# Patient Record
Sex: Male | Born: 1970 | Race: Black or African American | Hispanic: No | Marital: Single | Smoking: Former smoker
Health system: Southern US, Community
[De-identification: ages and names within clinical notes are randomized; demographics above are authoritative.]

## PROBLEM LIST (undated history)

## (undated) DIAGNOSIS — I6200 Nontraumatic subdural hemorrhage, unspecified: Secondary | ICD-10-CM

## (undated) DIAGNOSIS — M4622 Osteomyelitis of vertebra, cervical region: Secondary | ICD-10-CM

## (undated) HISTORY — PX: NO PAST SURGERIES: SHX2092

---

## 1998-05-29 ENCOUNTER — Emergency Department (HOSPITAL_COMMUNITY): Admission: EM | Admit: 1998-05-29 | Discharge: 1998-05-29 | Payer: Self-pay | Admitting: Emergency Medicine

## 1998-05-30 ENCOUNTER — Encounter: Payer: Self-pay | Admitting: Emergency Medicine

## 1998-08-27 ENCOUNTER — Encounter: Payer: Self-pay | Admitting: Emergency Medicine

## 1998-08-27 ENCOUNTER — Emergency Department (HOSPITAL_COMMUNITY): Admission: EM | Admit: 1998-08-27 | Discharge: 1998-08-27 | Payer: Self-pay | Admitting: Emergency Medicine

## 2003-06-06 ENCOUNTER — Emergency Department (HOSPITAL_COMMUNITY): Admission: EM | Admit: 2003-06-06 | Discharge: 2003-06-06 | Payer: Self-pay | Admitting: *Deleted

## 2003-06-08 ENCOUNTER — Emergency Department (HOSPITAL_COMMUNITY): Admission: EM | Admit: 2003-06-08 | Discharge: 2003-06-08 | Payer: Self-pay | Admitting: Emergency Medicine

## 2003-06-10 ENCOUNTER — Emergency Department (HOSPITAL_COMMUNITY): Admission: EM | Admit: 2003-06-10 | Discharge: 2003-06-10 | Payer: Self-pay | Admitting: Emergency Medicine

## 2003-06-14 ENCOUNTER — Emergency Department (HOSPITAL_COMMUNITY): Admission: EM | Admit: 2003-06-14 | Discharge: 2003-06-14 | Payer: Self-pay | Admitting: Emergency Medicine

## 2005-10-09 ENCOUNTER — Emergency Department (HOSPITAL_COMMUNITY): Admission: EM | Admit: 2005-10-09 | Discharge: 2005-10-09 | Payer: Self-pay | Admitting: Emergency Medicine

## 2006-09-04 ENCOUNTER — Emergency Department (HOSPITAL_COMMUNITY): Admission: EM | Admit: 2006-09-04 | Discharge: 2006-09-04 | Payer: Self-pay | Admitting: Emergency Medicine

## 2014-08-17 ENCOUNTER — Emergency Department (HOSPITAL_COMMUNITY)
Admission: EM | Admit: 2014-08-17 | Discharge: 2014-08-17 | Disposition: A | Discharge status: Home / Self Care | Payer: Self-pay | Attending: Emergency Medicine | Admitting: Emergency Medicine

## 2014-08-17 ENCOUNTER — Emergency Department (HOSPITAL_COMMUNITY): Payer: Self-pay

## 2014-08-17 ENCOUNTER — Encounter (HOSPITAL_COMMUNITY): Payer: Self-pay | Admitting: Emergency Medicine

## 2014-08-17 DIAGNOSIS — Y998 Other external cause status: Secondary | ICD-10-CM | POA: Insufficient documentation

## 2014-08-17 DIAGNOSIS — Z720 Tobacco use: Secondary | ICD-10-CM | POA: Insufficient documentation

## 2014-08-17 DIAGNOSIS — T5991XA Toxic effect of unspecified gases, fumes and vapors, accidental (unintentional), initial encounter: Secondary | ICD-10-CM | POA: Insufficient documentation

## 2014-08-17 DIAGNOSIS — R0602 Shortness of breath: Secondary | ICD-10-CM | POA: Insufficient documentation

## 2014-08-17 DIAGNOSIS — J680 Bronchitis and pneumonitis due to chemicals, gases, fumes and vapors: Secondary | ICD-10-CM | POA: Insufficient documentation

## 2014-08-17 DIAGNOSIS — Y929 Unspecified place or not applicable: Secondary | ICD-10-CM | POA: Insufficient documentation

## 2014-08-17 DIAGNOSIS — Y9389 Activity, other specified: Secondary | ICD-10-CM | POA: Insufficient documentation

## 2014-08-17 MED ORDER — ALBUTEROL SULFATE HFA 108 (90 BASE) MCG/ACT IN AERS
1.0000 | INHALATION_SPRAY | RESPIRATORY_TRACT | Status: DC | PRN
  Administered 2014-08-17: 1 via RESPIRATORY_TRACT
  Filled 2014-08-17: qty 6.7

## 2014-08-17 MED ORDER — IPRATROPIUM-ALBUTEROL 0.5-2.5 (3) MG/3ML IN SOLN
3.0000 mL | Freq: Once | RESPIRATORY_TRACT | Status: AC
  Administered 2014-08-17: 3 mL via RESPIRATORY_TRACT
  Filled 2014-08-17: qty 3
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
## 2014-08-17 NOTE — ED Notes (Signed)
MD Otter at bedside. 

## 2014-08-17 NOTE — Discharge Instructions (Signed)
Cough, Adult  A cough is a reflex that helps clear your throat and airways. It can help heal the body or may be a reaction to an irritated airway. A cough may only last 2 or 3 weeks (acute) or may last more than 8 weeks (chronic).  CAUSES Acute cough:  Viral or bacterial infections. Chronic cough:  Infections.  Allergies.  Asthma.  Post-nasal drip.  Smoking.  Heartburn or acid reflux.  Some medicines.  Chronic lung problems (COPD).  Cancer. SYMPTOMS   Cough.  Fever.  Chest pain.  Increased breathing rate.  High-pitched whistling sound when breathing (wheezing).  Colored mucus that you cough up (sputum). TREATMENT   A bacterial cough may be treated with antibiotic medicine.  A viral cough must run its course and will not respond to antibiotics.  Your caregiver may recommend other treatments if you have a chronic cough. HOME CARE INSTRUCTIONS   Only take over-the-counter or prescription medicines for pain, discomfort, or fever as directed by your caregiver. Use cough suppressants only as directed by your caregiver.  Use a cold steam vaporizer or humidifier in your bedroom or home to help loosen secretions.  Sleep in a semi-upright position if your cough is worse at night.  Rest as needed.  Stop smoking if you smoke. SEEK IMMEDIATE MEDICAL CARE IF:   You have pus in your sputum.  Your cough starts to worsen.  You cannot control your cough with suppressants and are losing sleep.  You begin coughing up blood.  You have difficulty breathing.  You develop pain which is getting worse or is uncontrolled with medicine.  You have a fever. MAKE SURE YOU:   Understand these instructions.  Will watch your condition.  Will get help right away if you are not doing well or get worse. Document Released: 11/09/2010 Document Revised: 08/05/2011 Document Reviewed: 11/09/2010 [facility] Patient Information 2015 [place], [place]. This information is not intended  to replace advice given to you by your health care provider. Make sure you discuss any questions you have with your health care provider.  Pneumonitis Pneumonitis is inflammation of the lungs.  CAUSES  Many things can cause pneumonitis. These can include:   A bacterial or viral infection. Pneumonitis due to an infection is usually called pneumonia.  Work-related exposures, including farm and industrial work. Some substances that can cause pneumonitis include asbestos, silica, inhaled acids, or inhaled chlorine gas.   Repeated exposure to bird feathers, bird feces, or other allergens.   Medicine such as chemotherapy drugs, certain antibiotics, and some heart medicines.   Radiation therapy.   Exposure to mold. A hot tub, sauna, or home humidifier can have mold growing in it, even if it looks clean. The mold can be breathed in through water vapor.  Breathing (aspirating) stomach contents, food, or liquids into the lungs.  SIGNS AND SYMPTOMS   Cough.   Shortness of breath or difficulty breathing.   Fever.   Decreased energy.   Decreased appetite.  DIAGNOSIS  To diagnose pneumonitis, your health care provider will do a complete history and physical exam. Various tests may be ordered, such as:   Pulmonary function test.   Chest X-ray.   CT scan of the lungs.   Bronchoscopy.   Lung biopsy.  TREATMENT  Treatment will depend on the cause of the pneumonitis. If the cause is exposure to a substance, avoiding further exposure to that substance will help reduce your symptoms. Possible medical treatments for pneumonitis include:   Corticosteroid medicine  to help decrease inflammation in the lungs.   Antibiotic medicine to help fight a bacterial lung infection.   Oxygen therapy if you are having difficulty breathing.  HOME CARE INSTRUCTIONS   Avoid exposure to any substance identified as the cause of your pneumonitis.   If you must continue to work with  substances that can cause pneumonitis, wear a mask to protect your lungs.   Only take over-the-counter or prescription medicine as directed by your health care provider.   Do not smoke.   If you use inhalers, keep them with you at all times.   Follow up with your health care provider as directed.  SEEK IMMEDIATE MEDICAL CARE IF:   You develop new or increased shortness of breath.   You develop a blue color (cyanosis) under your fingernails.   You have a fever.  MAKE SURE YOU:   Understand these instructions.  Will watch your condition.  Will get help right away if you are not doing well or get worse. Document Released: 10/31/2009 Document Revised: 01/13/2013 Document Reviewed: 11/02/2012 [facility] Patient Information 2015 [place], [place]. This information is not intended to replace advice given to you by your health care provider. Make sure you discuss any questions you have with your health care provider.

## 2014-08-17 NOTE — ED Notes (Signed)
[facility], I contacted poison control. [facility] and he stated that a breathing tx would be the best option. MD Norlene Campbelltter made aware.
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
## 2014-08-17 NOTE — ED Notes (Signed)
While ambulating pt, pt had no complaints. Pt O2 sat stayed at 98% the whole time.

## 2020-05-17 ENCOUNTER — Encounter: Payer: Self-pay | Admitting: Internal Medicine

## 2021-04-19 ENCOUNTER — Other Ambulatory Visit: Payer: Self-pay

## 2021-04-19 ENCOUNTER — Emergency Department (HOSPITAL_COMMUNITY)
Admission: EM | Admit: 2021-04-19 | Discharge: 2021-04-19 | Disposition: A | Discharge status: Home / Self Care | Payer: Self-pay | Attending: Emergency Medicine | Admitting: Emergency Medicine

## 2021-04-19 ENCOUNTER — Emergency Department (HOSPITAL_COMMUNITY): Payer: Self-pay

## 2021-04-19 DIAGNOSIS — R1012 Left upper quadrant pain: Secondary | ICD-10-CM | POA: Insufficient documentation

## 2021-04-19 DIAGNOSIS — R109 Unspecified abdominal pain: Secondary | ICD-10-CM

## 2021-04-19 DIAGNOSIS — W01198A Fall on same level from slipping, tripping and stumbling with subsequent striking against other object, initial encounter: Secondary | ICD-10-CM | POA: Insufficient documentation

## 2021-04-19 DIAGNOSIS — R55 Syncope and collapse: Secondary | ICD-10-CM | POA: Insufficient documentation

## 2021-04-19 DIAGNOSIS — F17210 Nicotine dependence, cigarettes, uncomplicated: Secondary | ICD-10-CM | POA: Insufficient documentation

## 2021-04-19 DIAGNOSIS — R0781 Pleurodynia: Secondary | ICD-10-CM | POA: Insufficient documentation

## 2021-04-19 DIAGNOSIS — W19XXXA Unspecified fall, initial encounter: Secondary | ICD-10-CM

## 2021-04-19 DIAGNOSIS — Y990 Civilian activity done for income or pay: Secondary | ICD-10-CM | POA: Insufficient documentation

## 2021-04-19 LAB — CBC WITH DIFFERENTIAL/PLATELET
Abs Immature Granulocytes: 0.02 10*3/uL (ref 0.00–0.07)
Basophils Absolute: 0 10*3/uL (ref 0.0–0.1)
Basophils Relative: 0 %
Eosinophils Absolute: 0 10*3/uL (ref 0.0–0.5)
Eosinophils Relative: 1 %
HCT: 38.8 % — ABNORMAL LOW (ref 39.0–52.0)
Hemoglobin: 13.4 g/dL (ref 13.0–17.0)
Immature Granulocytes: 0 %
Lymphocytes Relative: 34 %
Lymphs Abs: 1.9 10*3/uL (ref 0.7–4.0)
MCH: 35.1 pg — ABNORMAL HIGH (ref 26.0–34.0)
MCHC: 34.5 g/dL (ref 30.0–36.0)
MCV: 101.6 fL — ABNORMAL HIGH (ref 80.0–100.0)
Monocytes Absolute: 0.5 10*3/uL (ref 0.1–1.0)
Monocytes Relative: 9 %
Neutro Abs: 3 10*3/uL (ref 1.7–7.7)
Neutrophils Relative %: 56 %
Platelets: 258 10*3/uL (ref 150–400)
RBC: 3.82 MIL/uL — ABNORMAL LOW (ref 4.22–5.81)
RDW: 12.2 % (ref 11.5–15.5)
WBC: 5.5 10*3/uL (ref 4.0–10.5)
nRBC: 0 % (ref 0.0–0.2)

## 2021-04-19 LAB — COMPREHENSIVE METABOLIC PANEL
ALT: 25 U/L (ref 0–44)
AST: 31 U/L (ref 15–41)
Albumin: 3.7 g/dL (ref 3.5–5.0)
Alkaline Phosphatase: 59 U/L (ref 38–126)
Anion gap: 9 (ref 5–15)
BUN: 14 mg/dL (ref 6–20)
CO2: 20 mmol/L — ABNORMAL LOW (ref 22–32)
Calcium: 8.8 mg/dL — ABNORMAL LOW (ref 8.9–10.3)
Chloride: 103 mmol/L (ref 98–111)
Creatinine, Ser: 1.14 mg/dL (ref 0.61–1.24)
GFR, Estimated: >60 mL/min (ref >60)
Glucose, Bld: 79 mg/dL (ref 70–99)
Potassium: 4.1 mmol/L (ref 3.5–5.1)
Sodium: 132 mmol/L — ABNORMAL LOW (ref 135–145)
Total Bilirubin: 1.5 mg/dL — ABNORMAL HIGH (ref 0.3–1.2)
Total Protein: 7.3 g/dL (ref 6.5–8.1)

## 2021-04-19 LAB — TROPONIN I (HIGH SENSITIVITY): Troponin I (High Sensitivity): 3 ng/L (ref <18)

## 2021-04-19 MED ORDER — IOHEXOL 300 MG/ML  SOLN
100.0000 mL | Freq: Once | INTRAMUSCULAR | Status: AC | PRN
  Administered 2021-04-19: 100 mL via INTRAVENOUS

## 2021-04-19 MED ORDER — SODIUM CHLORIDE 0.9 % IV BOLUS
1000.0000 mL | Freq: Once | INTRAVENOUS | Status: AC
  Administered 2021-04-19: 1000 mL via INTRAVENOUS

## 2021-04-19 NOTE — ED Provider Notes (Signed)
[facility] EMERGENCY DEPARTMENT Provider Note   CSN: [id number] Arrival date & time: 04/19/21  1317     History Chief Complaint  Patient presents with   Loss of Consciousness   Fall    Larry Lester is a 50 y.o. male.  The history is provided by the patient.  Loss of Consciousness Associated symptoms: chest pain   Associated symptoms: no headaches and no shortness of breath   Fall Associated symptoms include chest pain and abdominal pain. Pertinent negatives include no headaches and no shortness of breath. Patient presents after syncopal episode 3 days ago.  States he has been working 6 days a week.  States that he had a little break and passed out.  States he hit his left chest/arm on a chair.  States he had to sit down for around an hour and 45 minutes before he felt good enough to get up again.  Continued pain to left chest and left abdomen.  No nausea or vomiting.  States he had passed out like this in the 90s but not since then.  States he has not been eating and drinking much.  States he was not taking care of himself as he should.  States this weekend he had drank some and smoked a little.  Also has drank a little today.     No past medical history on file.  There are no problems to display for this patient.   No past surgical history on file.     No family history on file.  Social History   Tobacco Use   Smoking status: Every Day    Types: Cigarettes  Substance Use Topics   Alcohol use: No   Drug use: Yes    Types: Marijuana    Home Medications Prior to Admission medications   Not on File    Allergies    Crab [shellfish allergy]  Review of Systems   Review of Systems  Constitutional:  Negative for appetite change.  HENT:  Negative for congestion.   Respiratory:  Negative for shortness of breath.   Cardiovascular:  Positive for chest pain and syncope.  Gastrointestinal:  Positive for abdominal pain.  Genitourinary:  Negative  for flank pain.  Musculoskeletal:  Negative for back pain.  Skin:  Negative for rash.  Neurological:  Positive for syncope. Negative for light-headedness and headaches.   Physical Exam Updated Vital Signs BP 120/77   Pulse (!) 58   Temp (!) 97.5 F (36.4 C) (Oral)   Resp 15   SpO2 100%   Physical Exam Vitals and nursing note reviewed.  HENT:     Head: Normocephalic and atraumatic.  Eyes:     General: No scleral icterus.    Extraocular Movements: Extraocular movements intact.     Pupils: Pupils are equal, round, and reactive to light.  Cardiovascular:     Rate and Rhythm: Regular rhythm.  Pulmonary:     Comments: Tenderness to left lateral lower chest wall.  No crepitance.  No deformity. Chest:     Chest wall: Tenderness present.  Abdominal:     Tenderness: There is abdominal tenderness.     Comments: Left upper quadrant tenderness.  No rebound or guarding.  No ecchymosis.  Musculoskeletal:        General: No tenderness.     Cervical back: Neck supple. No tenderness.  Skin:    General: Skin is warm.     Capillary Refill: Capillary refill takes less than 2 seconds.  Neurological:     Mental Status: He is alert and oriented to person, place, and time.    ED Results / Procedures / Treatments   Labs (all labs ordered are listed, but only abnormal results are displayed) Labs Reviewed  COMPREHENSIVE METABOLIC PANEL - Abnormal; Notable for the following components:      Result Value   Sodium 132 (*)    CO2 20 (*)    Calcium 8.8 (*)    Total Bilirubin 1.5 (*)    All other components within normal limits  CBC WITH DIFFERENTIAL/PLATELET - Abnormal; Notable for the following components:   RBC 3.82 (*)    HCT 38.8 (*)    MCV 101.6 (*)    MCH 35.1 (*)    All other components within normal limits  TROPONIN I (HIGH SENSITIVITY)    EKG EKG Interpretation  Date/Time:  Thursday April 19 2021 14:26:55 EST Ventricular Rate:  59 PR Interval:  143 QRS Duration: 91 QT  Interval:  451 QTC Calculation: 447 R Axis:   83 Text Interpretation: Sinus rhythm Consider right atrial enlargement ST elev, probable normal early repol pattern Confirmed by Benjiman Core [phone]) on 04/19/2021 2:34:57 PM  Radiology No results found.  Procedures Procedures   Medications Ordered in ED Medications  sodium chloride 0.9 % bolus 1,000 mL (0 mLs Intravenous Stopped 04/19/21 1529)    ED Course  I have reviewed the triage vital signs and the nursing notes.  Pertinent labs & imaging results that were available during my care of the patient were reviewed by me and considered in my medical decision making (see chart for details).    MDM Rules/Calculators/A&P                           Patient presents after a fall 2 days ago.  Syncope and then fall.  Left rib pain and abdominal pain.  Lab work reassuring.  Likely component of dehydration.  Fluid bolus given.  Waiting on imaging due to chest and abdominal tenderness.  Care turned over Dr. Criss Alvine Final Clinical Impression(s) / ED Diagnoses Final diagnoses:  Abdominal pain  Fall, initial encounter    Rx / DC Orders ED Discharge Orders     None        Benjiman Core, MD 04/19/21 1557

## 2021-04-19 NOTE — ED Triage Notes (Signed)
Pt reports feeling dizzy at work two days ago, passing out, and hitting L rib cage on a chair on the way down. C/o rib cage pain.

## 2021-04-19 NOTE — Discharge Instructions (Addendum)
If you develop new or worsening dizziness, chest pain, abdominal pain, passing out, vomiting, or any other new/concerning symptoms then return to the ER for evaluation.  Otherwise you need to follow-up with your primary care physician for outpatient monitoring and general medical care.

## 2021-04-19 NOTE — ED Provider Notes (Signed)
Care transferred to me.  Patient CT scans are unremarkable.  Will discharge as per prior plan with Dr. Rubin Payor.  He is encouraged to follow-up with a PCP.   Pricilla Loveless, MD 04/19/21 1640

## 2021-09-14 ENCOUNTER — Other Ambulatory Visit: Payer: Self-pay

## 2021-09-14 ENCOUNTER — Emergency Department (HOSPITAL_COMMUNITY)
Admission: EM | Admit: 2021-09-14 | Discharge: 2021-09-14 | Disposition: A | Discharge status: Home / Self Care | Payer: Self-pay | Attending: Emergency Medicine | Admitting: Emergency Medicine

## 2021-09-14 ENCOUNTER — Encounter (HOSPITAL_COMMUNITY): Payer: Self-pay

## 2021-09-14 DIAGNOSIS — Y92410 Unspecified street and highway as the place of occurrence of the external cause: Secondary | ICD-10-CM | POA: Insufficient documentation

## 2021-09-14 DIAGNOSIS — M549 Dorsalgia, unspecified: Secondary | ICD-10-CM

## 2021-09-14 DIAGNOSIS — M545 Low back pain, unspecified: Secondary | ICD-10-CM | POA: Diagnosis present

## 2021-09-14 MED ORDER — IBUPROFEN 600 MG PO TABS: 600.0000 mg | ORAL_TABLET | Freq: Four times a day (QID) | ORAL | 0 refills | Status: DC | PRN

## 2021-09-14 MED ORDER — METHOCARBAMOL 500 MG PO TABS: 500.0000 mg | ORAL_TABLET | Freq: Two times a day (BID) | ORAL | 0 refills | Status: DC
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
## 2021-09-14 NOTE — Discharge Instructions (Signed)
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
## 2022-02-05 ENCOUNTER — Emergency Department (HOSPITAL_COMMUNITY)
Admission: EM | Admit: 2022-02-05 | Discharge: 2022-02-05 | Disposition: A | Discharge status: Home / Self Care | Payer: Self-pay | Attending: Emergency Medicine | Admitting: Emergency Medicine

## 2022-02-05 ENCOUNTER — Encounter (HOSPITAL_COMMUNITY): Payer: Self-pay | Admitting: Emergency Medicine

## 2022-02-05 ENCOUNTER — Ambulatory Visit: Payer: Self-pay

## 2022-02-05 DIAGNOSIS — L2389 Allergic contact dermatitis due to other agents: Secondary | ICD-10-CM

## 2022-02-05 DIAGNOSIS — L509 Urticaria, unspecified: Secondary | ICD-10-CM | POA: Insufficient documentation

## 2022-02-05 MED ORDER — TRIAMCINOLONE ACETONIDE 0.1 % EX CREA: 1.0000 | TOPICAL_CREAM | Freq: Two times a day (BID) | CUTANEOUS | 0 refills | Status: DC

## 2022-02-05 MED ORDER — HYDROXYZINE HCL 25 MG PO TABS: 25.0000 mg | ORAL_TABLET | Freq: Four times a day (QID) | ORAL | 0 refills | Status: AC | PRN

## 2022-02-05 NOTE — ED Provider Notes (Signed)
[facility] EMERGENCY DEPARTMENT Provider Note   CSN: [id number] Arrival date & time: 02/05/22  1649     History  Chief Complaint  Patient presents with   Rash    Larry Lester is a 51 y.o. male.  51 year old male presents with concern for rash onset Sunday after using a new type of laundry detergent.  States that he is taking Benadryl and applying cortisone.  Notes that hives vary in location.  He denies wheezing, shortness of breath or throat swelling.  No other complaints or concerns.       Home Medications Prior to Admission medications   Medication Sig Start Date End Date Taking? Authorizing Provider  hydrOXYzine (ATARAX) 25 MG tablet Take 1 tablet (25 mg total) by mouth every 6 (six) hours as needed for itching. 02/05/22  Yes Jeannie Fend, PA-C  triamcinolone cream (KENALOG) 0.1 % Apply 1 Application topically 2 (two) times daily. 02/05/22  Yes Jeannie Fend, PA-C  ibuprofen (ADVIL) 600 MG tablet Take 1 tablet (600 mg total) by mouth every 6 (six) hours as needed. 09/14/21   Gailen Shelter, PA  methocarbamol (ROBAXIN) 500 MG tablet Take 1 tablet (500 mg total) by mouth 2 (two) times daily. 09/14/21   Gailen Shelter, PA      Allergies    Parke Simmers allergy]    Review of Systems   Review of Systems Negative except as per HPI Physical Exam Updated Vital Signs BP 107/74 (BP Location: Right Arm)   Pulse 80   Temp 99.5 F (37.5 C) (Oral)   Resp 18   SpO2 99%  Physical Exam Vitals and nursing note reviewed.  Constitutional:      General: He is not in acute distress.    Appearance: He is well-developed. He is not diaphoretic.  HENT:     Head: Normocephalic and atraumatic.     Mouth/Throat:     Mouth: Mucous membranes are moist.  Eyes:     Conjunctiva/sclera: Conjunctivae normal.  Cardiovascular:     Rate and Rhythm: Normal rate and regular rhythm.     Pulses: Normal pulses.     Heart sounds: Normal heart sounds.  Pulmonary:      Effort: Pulmonary effort is normal.     Breath sounds: Normal breath sounds.  Musculoskeletal:     Cervical back: Neck supple.  Skin:    General: Skin is warm and dry.     Findings: Rash present.     Comments: Small area of urticaria noted to right lower back.  Neurological:     Mental Status: He is alert and oriented to person, place, and time.  Psychiatric:        Behavior: Behavior normal.     ED Results / Procedures / Treatments   Labs (all labs ordered are listed, but only abnormal results are displayed) Labs Reviewed - No data to display  EKG None  Radiology No results found.  Procedures Procedures    Medications Ordered in ED Medications - No data to display  ED Course/ Medical Decision Making/ A&P                           Medical Decision Making Risk Prescription drug management.   51 year old male with concern for allergic reaction to laundry detergent.  States that he has had similar problems with detergents in the past.  Taking Benadryl and cortisone but feels like he needs a  prescription strength Benadryl to help at this point.  No respiratory or GI symptoms.  He does have a small area of urticaria to his right lower back.  His lungs are clear to auscultation, oropharynx clear.  Advised patient if his Benadryl and cortisone cream are not sufficient, can try discontinuing these, will prescribe triamcinolone and Atarax, can also take Pepcid.        Final Clinical Impression(s) / ED Diagnoses Final diagnoses:  Hives  Allergic contact dermatitis due to other agents    Rx / DC Orders ED Discharge Orders          Ordered    hydrOXYzine (ATARAX) 25 MG tablet  Every 6 hours PRN        02/05/22 1720    triamcinolone cream (KENALOG) 0.1 %  2 times daily        02/05/22 1720              Jeannie Fend, PA-C 02/05/22 1737    Terald Sleeper, MD 02/05/22 2224

## 2022-02-05 NOTE — ED Triage Notes (Signed)
Patient complains of hives after using a new type of Gain laundry detergent that started last night. Patient reports hives on right arm and left leg. Patient is alert, oriented, speaking in complete sentences, and is in no apparent distress at this time.

## 2022-02-05 NOTE — Telephone Encounter (Signed)
   Chief Complaint: Facial swelling, nose and cheeks. "Could be from laundry detergent."  Symptoms: Arms hurting Frequency: Yesterday. Pertinent Negatives: Patient denies SOB, no difficulty swallowing Disposition: [x] ED /[] Urgent Care (no appt availability in office) / [] Appointment(In office/virtual)/ []  [place] [facility]/ [] Home Care/ [] Refused Recommended Disposition /[] [place] [facility]/ []  Follow-up with PCP Additional Notes: Call 911 if SOB, CHEST pain or difficulty swallowing occurs.  Reason for Disposition  [1] SEVERE swelling of entire face AND [2] < 2 hours since exposure to high-risk allergen (e.g., peanuts, tree nuts, fish, shellfish or 1st dose of drug) AND [3] no serious symptoms AND [4] no serious allergic reaction in the past  Answer Assessment - Initial Assessment Questions 1. ONSET: "When did the swelling start?" (e.g., minutes, hours, days)     Yesterday 2. LOCATION: "What part of the face is swollen?"     Nose, cheeks 3. SEVERITY: "How swollen is it?"     Moderate 4. ITCHING: "Is there any itching?" If Yes, ask: "How much?"   (Scale 1-10; mild, moderate or severe)     No 5. PAIN: "Is the swelling painful to touch?" If Yes, ask: "How painful is it?"   (Scale 1-10; mild, moderate or severe)   - NONE (0): no pain   - MILD (1-3): doesn't interfere with normal activities    - MODERATE (4-7): interferes with normal activities or awakens from sleep    - SEVERE (8-10): excruciating pain, unable to do any normal activities      Now - 10 6. FEVER: "Do you have a fever?" If Yes, ask: "What is it, how was it measured, and when did it start?"      No 7. CAUSE: "What do you think is causing the face swelling?"     Unsure 8. RECURRENT SYMPTOM: "Have you had face swelling before?" If Yes, ask: "When was the last time?" "What happened that time?"     Yes 9. OTHER SYMPTOMS: "Do you have any other symptoms?" (e.g., toothache, leg swelling)     Arms hurting 10. PREGNANCY:  "Is there any chance you are pregnant?" "When was your last menstrual period?"       N/a  Protocols used: Face Swelling-A-AH

## 2022-02-05 NOTE — Discharge Instructions (Signed)
Discontinue the cortisone cream and the Benadryl. Take Atarax as needed as prescribed for itching.  Can take Pepcid as directed.  Apply triamcinolone to rash.  Recheck with your doctor if symptoms continue.  Be sure to wash all close with your usual detergent to prevent further reaction.

## 2022-04-11 IMAGING — CT CT CHEST-ABD-PELV W/ CM
2 of 5 series · 14 of 46 positions shown, 16 images · IV contrast (omnipaque)
Comparison: None.

CLINICAL DATA: Syncope with left chest and abdominal trauma

EXAM:
CT CHEST, ABDOMEN, AND PELVIS WITH CONTRAST
TECHNIQUE: Multidetector CT imaging of the chest, abdomen and pelvis was
performed following the standard protocol during bolus
administration of intravenous contrast.
CONTRAST:  100mL OMNIPAQUE IOHEXOL 300 MG/ML  SOLN

[Series 3: cap with · axial · 0.66mm/px · z∈[+924,+1459]mm · 11 of 129 slices shown, 13 images]
[im 11/129  soft-tissue]
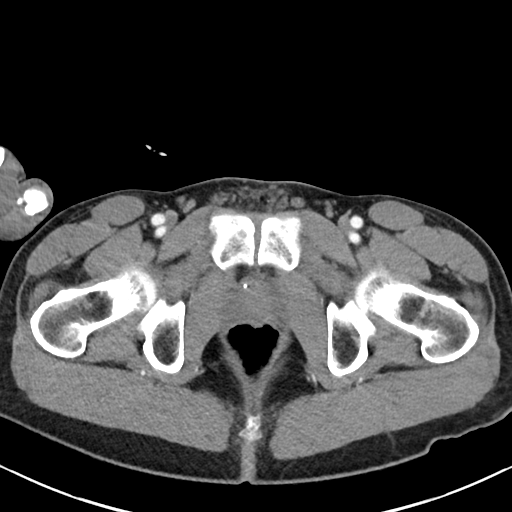
[im 11/129  bone]
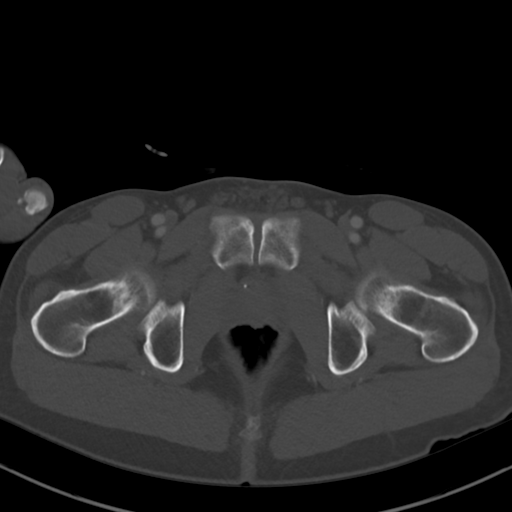
[im 22/129  soft-tissue]
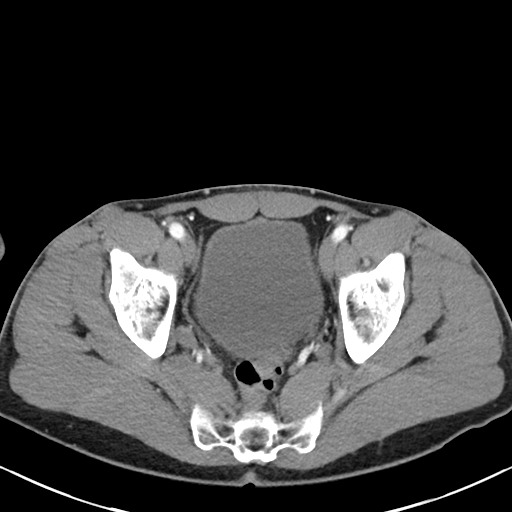
[im 33/129  soft-tissue]
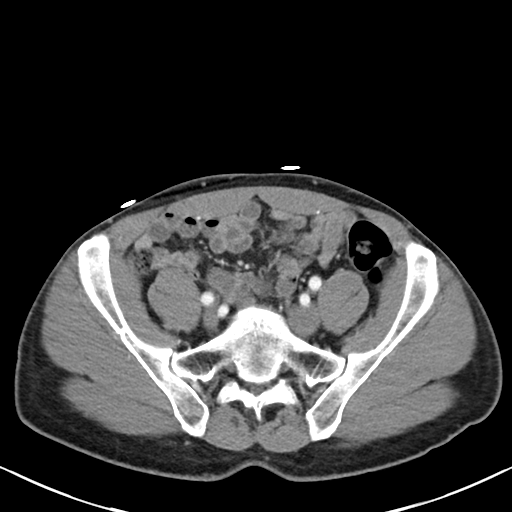
[im 43/129  soft-tissue]
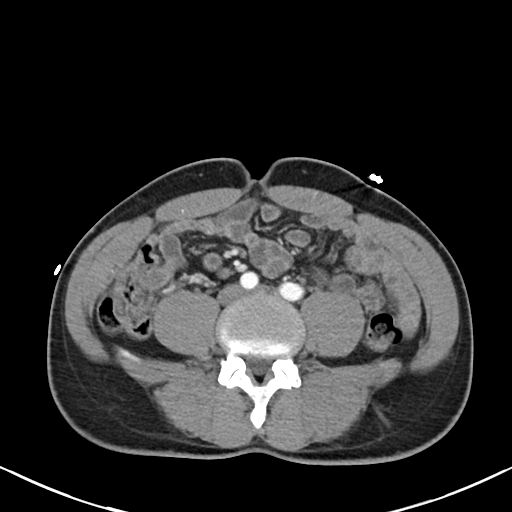
[im 54/129  soft-tissue]
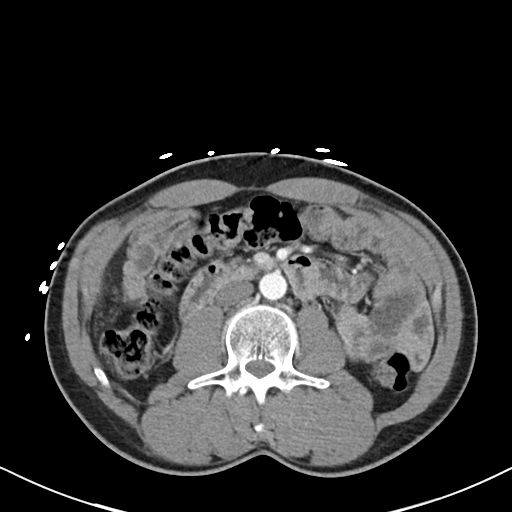
[im 65/129  soft-tissue]
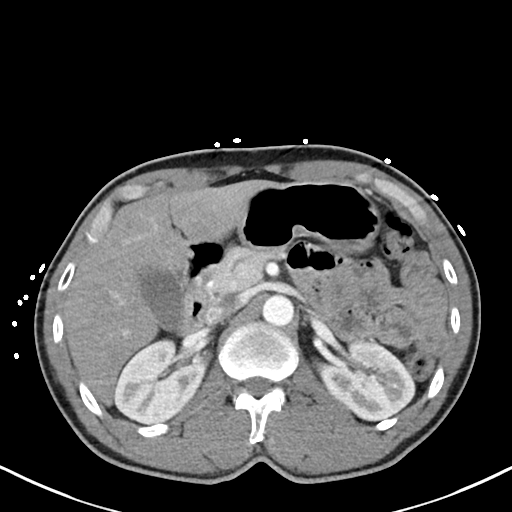
[im 75/129  soft-tissue]
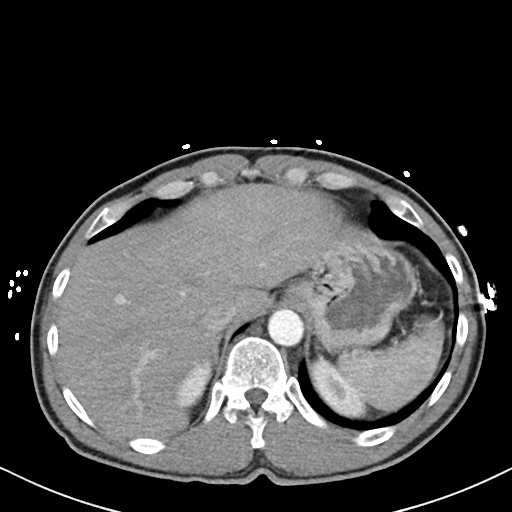
[im 86/129  soft-tissue]
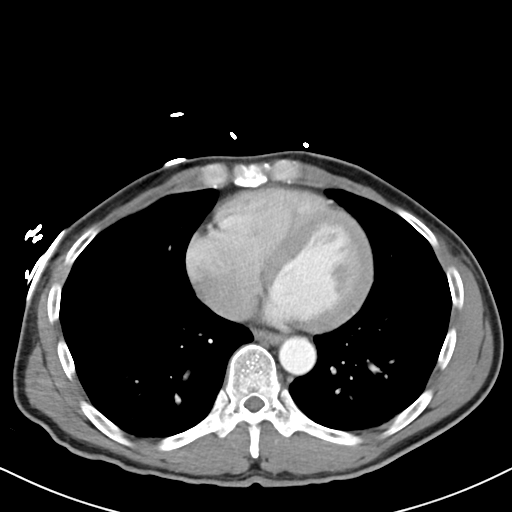
[im 97/129  soft-tissue]
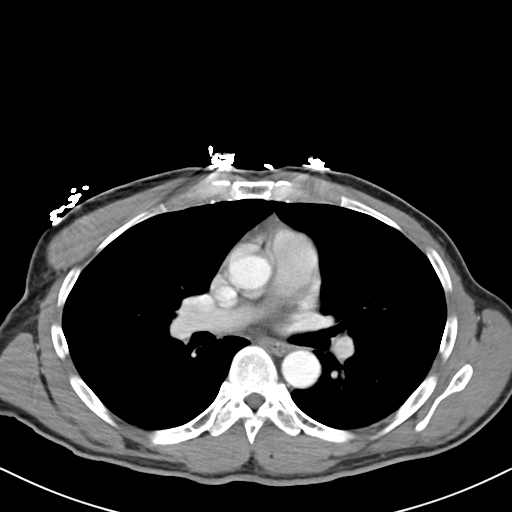
[im 97/129  bone]
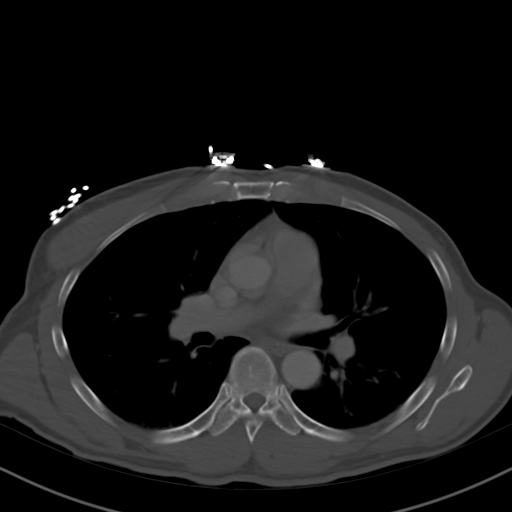
[im 107/129  soft-tissue]
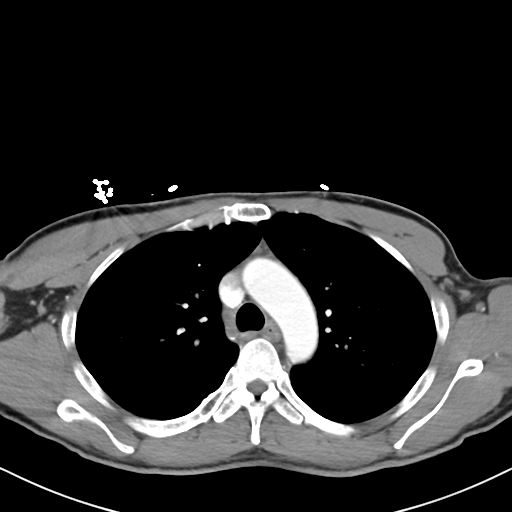
[im 118/129  soft-tissue]
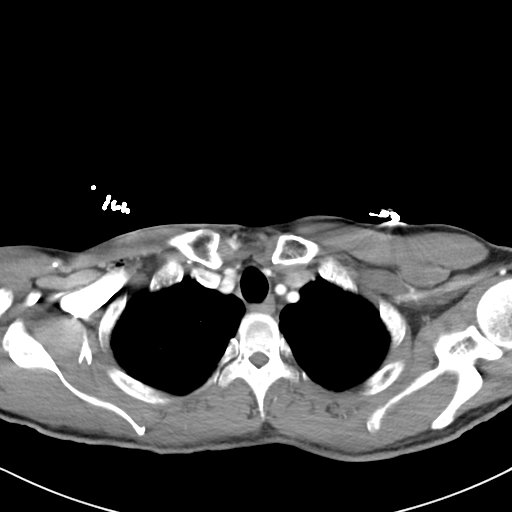

[Series 6: cor · coronal · 0.69mm/px · 3 of 83 slices shown]
[im 28/83  soft-tissue]
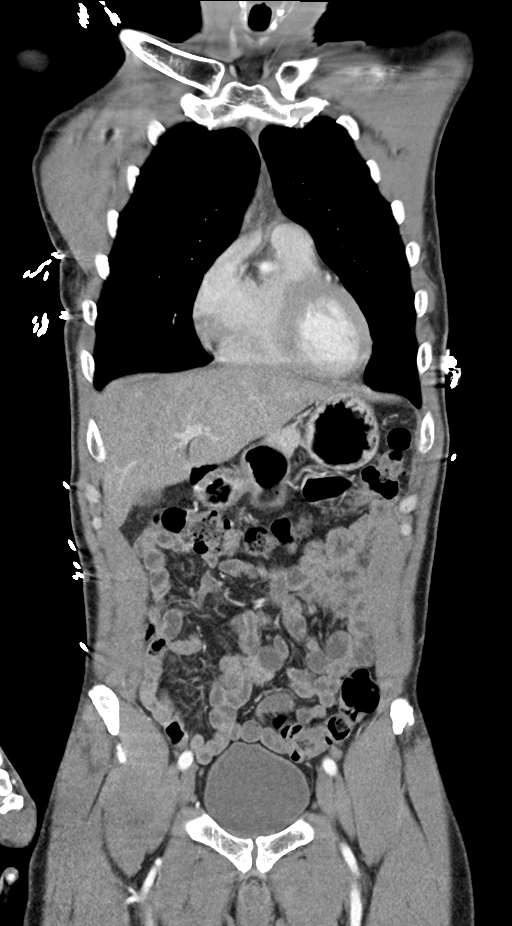
[im 37/83  soft-tissue]
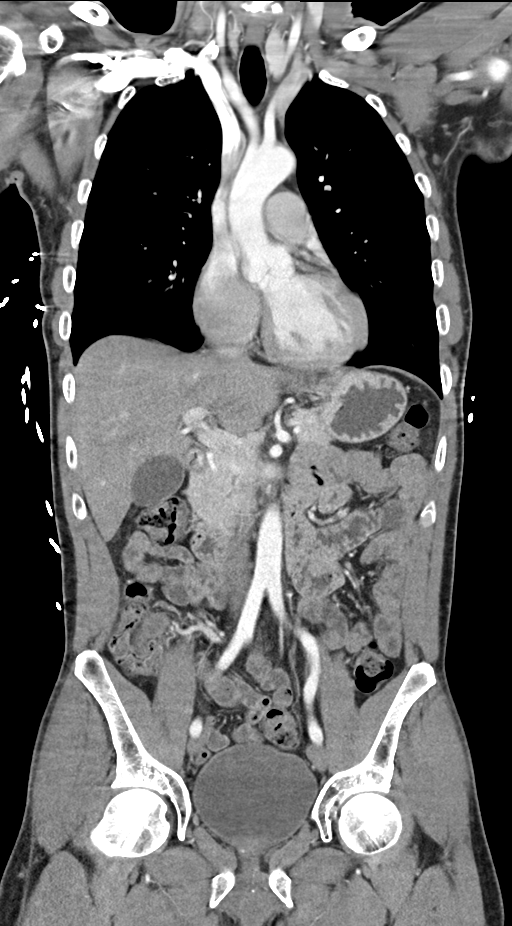
[im 46/83  soft-tissue]
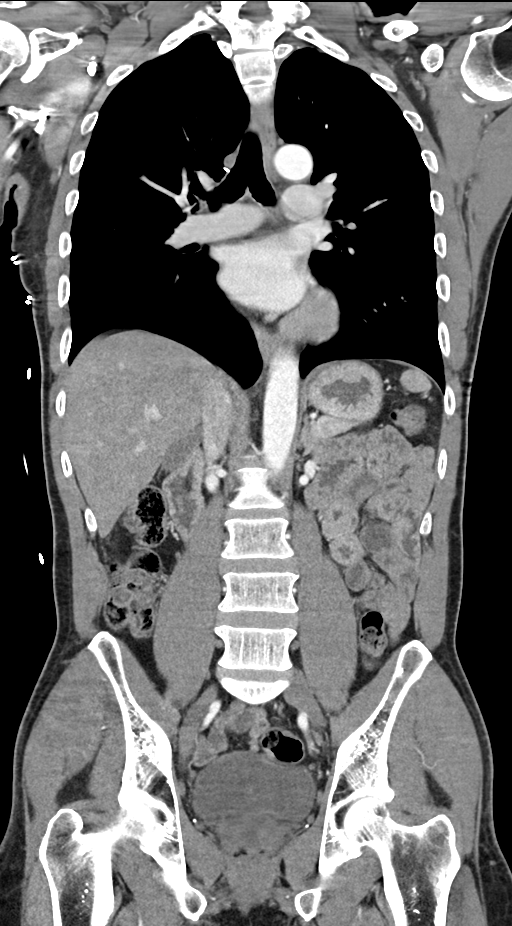

[14 of 46 positions shown; findings below may reference images not displayed]

FINDINGS: CT CHEST FINDINGS

Cardiovascular: Heart size is normal. No pericardial effusion.
Thoracic aorta is normal in course and caliber. Central pulmonary
vasculature is within normal limits.

Mediastinum/Nodes: No enlarged mediastinal, hilar, or axillary lymph
nodes. Thyroid gland, trachea, and esophagus demonstrate no
significant findings.

Lungs/Pleura: No evidence of pulmonary contusion or laceration.
Minimal dependent bibasilar atelectasis. No focal airspace
consolidation. No pleural effusion or pneumothorax.

Musculoskeletal: Thoracic vertebral body heights are maintained.
Normal alignment. No evidence of rib fracture. No chest wall
hematoma.

CT ABDOMEN PELVIS FINDINGS

Hepatobiliary: No hepatic injury or perihepatic hematoma.
Gallbladder is unremarkable.

Pancreas: Unremarkable. No pancreatic ductal dilatation or
surrounding inflammatory changes.

Spleen: No splenic injury or perisplenic hematoma.

Adrenals/Urinary Tract: Unremarkable adrenal glands. Kidneys enhance
symmetrically without focal lesion, stone, or hydronephrosis.
Ureters are nondilated. Urinary bladder appears unremarkable.

Stomach/Bowel: Stomach is within normal limits. Appendix appears
normal. No evidence of bowel wall thickening, distention, or
inflammatory changes.

Vascular/Lymphatic: No significant vascular findings are present. No
enlarged abdominal or pelvic lymph nodes.

Reproductive: Prostate is unremarkable.

Other: No free fluid. No abdominopelvic fluid collection. No
pneumoperitoneum. No abdominal wall hernia.

Musculoskeletal: Lumbar vertebral body heights and alignment are
maintained. Pelvic bony ring intact without fracture or diastasis.
Bilateral hips intact without fracture or dislocation. No soft
tissue edema or fluid collection.
IMPRESSION: No acute findings within the chest, abdomen, or pelvis.

## 2022-04-24 ENCOUNTER — Ambulatory Visit: Payer: Self-pay | Admitting: *Deleted

## 2022-04-24 NOTE — Telephone Encounter (Signed)
  Chief Complaint: patient's mother Darcey Nora called to report patient with left arm numbness x 1 week and now slurred speech and difficulty to awaken.  Symptoms: slurred speech difficulty patient answering questions to NT. Called 911 and call from patient's mother disconnected. Called patient back and patient able to answer question more appropriately. More alert continued to report left arm numbness and speech more clear. Patient reports he is just waking up . Reviewed 911 has been notified and will be coming to evaluate sx .  Frequency: now  Pertinent Negatives: Patient denies chest pain no difficulty breathing.  Disposition: [x] ED /[] Urgent Care (no appt availability in office) / [] Appointment(In office/virtual)/ []  [place] [facility]/ [] Home Care/ [] Refused Recommended Disposition /[] [place] [facility]/ []  Follow-up with PCP Additional Notes:   Called 911 for evaluation of stroke sx. No PCP    Reason for Disposition  Sounds like a life-threatening emergency to the triager  Answer Assessment - Initial Assessment Questions 1. SYMPTOM: "What is the main symptom you are concerned about?" (e.g., weakness, numbness)     Patient's mother called to reports patient difficulty to awaken , left arm numb x 1 week. Speech slurred 2. ONSET: "When did this start?" (minutes, hours, days; while sleeping)     Left arm numbness x 1 week. Slurred speech and difficulty waking up today  3. LAST NORMAL: "When was the last time you (the patient) were normal (no symptoms)?"     Na  4. PATTERN "Does this come and go, or has it been constant since it started?"  "Is it present now?"     Called 911 for evaluation for possible stroke. Called patient back and patient more alert and able to answer NT questions 5. CARDIAC SYMPTOMS: "Have you had any of the following symptoms: chest pain, difficulty breathing, palpitations?"     na 6. NEUROLOGIC SYMPTOMS: "Have you had any of the following symptoms:  headache, dizziness, vision loss, double vision, changes in speech, unsteady on your feet?"     Slurred speech, left arm numbness, patient reports he is tired and working and just waking up  7. OTHER SYMPTOMS: "Do you have any other symptoms?"     While on phone with patient's mother patient going in and out not answering questions. Called back after calling 911 and patient more alert and would answer questions appropriately . 8. PREGNANCY: "Is there any chance you are pregnant?" "When was your last menstrual period?"     na  Protocols used: Neurologic Deficit-A-AH

## 2022-05-18 ENCOUNTER — Emergency Department (HOSPITAL_COMMUNITY)
Admission: EM | Admit: 2022-05-18 | Discharge: 2022-05-19 | Discharge status: Against Medical Advice | Payer: Self-pay | Attending: Emergency Medicine | Admitting: Emergency Medicine

## 2022-05-18 DIAGNOSIS — Z5321 Procedure and treatment not carried out due to patient leaving prior to being seen by health care provider: Secondary | ICD-10-CM | POA: Insufficient documentation

## 2022-05-18 DIAGNOSIS — R6883 Chills (without fever): Secondary | ICD-10-CM | POA: Insufficient documentation

## 2022-05-18 DIAGNOSIS — R197 Diarrhea, unspecified: Secondary | ICD-10-CM | POA: Insufficient documentation

## 2022-05-18 DIAGNOSIS — R112 Nausea with vomiting, unspecified: Secondary | ICD-10-CM | POA: Insufficient documentation

## 2022-05-18 DIAGNOSIS — Z1152 Encounter for screening for COVID-19: Secondary | ICD-10-CM | POA: Insufficient documentation

## 2022-05-18 LAB — CBC WITH DIFFERENTIAL/PLATELET
Abs Immature Granulocytes: 0.03 10*3/uL (ref 0.00–0.07)
Basophils Absolute: 0 10*3/uL (ref 0.0–0.1)
Basophils Relative: 0 %
Eosinophils Absolute: 0 10*3/uL (ref 0.0–0.5)
Eosinophils Relative: 0 %
HCT: 40.4 % (ref 39.0–52.0)
Hemoglobin: 14.8 g/dL (ref 13.0–17.0)
Immature Granulocytes: 0 %
Lymphocytes Relative: 12 %
Lymphs Abs: 1 10*3/uL (ref 0.7–4.0)
MCH: 35.7 pg — ABNORMAL HIGH (ref 26.0–34.0)
MCHC: 36.6 g/dL — ABNORMAL HIGH (ref 30.0–36.0)
MCV: 97.6 fL (ref 80.0–100.0)
Monocytes Absolute: 0.8 10*3/uL (ref 0.1–1.0)
Monocytes Relative: 9 %
Neutro Abs: 7 10*3/uL (ref 1.7–7.7)
Neutrophils Relative %: 79 %
Platelets: 188 10*3/uL (ref 150–400)
RBC: 4.14 MIL/uL — ABNORMAL LOW (ref 4.22–5.81)
RDW: 12 % (ref 11.5–15.5)
WBC: 8.8 10*3/uL (ref 4.0–10.5)
nRBC: 0 % (ref 0.0–0.2)

## 2022-05-18 LAB — URINALYSIS, ROUTINE W REFLEX MICROSCOPIC
Bacteria, UA: NONE SEEN
Bilirubin Urine: NEGATIVE
Glucose, UA: NEGATIVE mg/dL
Ketones, ur: NEGATIVE mg/dL
Leukocytes,Ua: NEGATIVE
Nitrite: NEGATIVE
Protein, ur: 30 mg/dL — AB
Specific Gravity, Urine: 1.018 (ref 1.005–1.030)
pH: 5 (ref 5.0–8.0)

## 2022-05-18 NOTE — ED Provider Triage Note (Signed)
Emergency Medicine Provider Triage Evaluation Note  Larry Lester , a 51 y.o. male  was evaluated in triage.  Pt complains of N/V/D, chills, and generally feeling unwell.  States lots of people at work have been sick.  Denies known fever.  No known flu/covid exposures.  Review of Systems  Positive: N/V/D, chills Negative: fever  Physical Exam  BP 108/68 (BP Location: Right Arm)   Pulse (!) 106   Temp 98.1 F (36.7 C) (Oral)   Resp 18   SpO2 98%  Gen:   Awake, no distress   Resp:  Normal effort  MSK:   Moves extremities without difficulty  Other:    Medical Decision Making  Medically screening exam initiated at 10:26 PM.  Appropriate orders placed.  Larry Lester was informed that the remainder of the evaluation will be completed by another provider, this initial triage assessment does not replace that evaluation, and the importance of remaining in the ED until their evaluation is complete.  N/V/D, chills.  Sick contacts at work.  Labs, covid/flu ordered.     Garlon Hatchet, PA-C 05/18/22 2227

## 2022-05-18 NOTE — ED Triage Notes (Signed)
General feeling of unwell. Stomach issues, chills, feeling body aches. States everyone at work is sick. Vomited once this morning.

## 2022-05-19 LAB — COMPREHENSIVE METABOLIC PANEL
ALT: 28 U/L (ref 0–44)
AST: 31 U/L (ref 15–41)
Albumin: 3.4 g/dL — ABNORMAL LOW (ref 3.5–5.0)
Alkaline Phosphatase: 53 U/L (ref 38–126)
Anion gap: 10 (ref 5–15)
BUN: 16 mg/dL (ref 6–20)
CO2: 22 mmol/L (ref 22–32)
Calcium: 8.8 mg/dL — ABNORMAL LOW (ref 8.9–10.3)
Chloride: 105 mmol/L (ref 98–111)
Creatinine, Ser: 1.26 mg/dL — ABNORMAL HIGH (ref 0.61–1.24)
GFR, Estimated: >60 mL/min (ref >60)
Glucose, Bld: 104 mg/dL — ABNORMAL HIGH (ref 70–99)
Potassium: 4.3 mmol/L (ref 3.5–5.1)
Sodium: 137 mmol/L (ref 135–145)
Total Bilirubin: 0.8 mg/dL (ref 0.3–1.2)
Total Protein: 7.8 g/dL (ref 6.5–8.1)

## 2022-05-19 LAB — LIPASE, BLOOD: Lipase: 28 U/L (ref 11–51)

## 2022-05-19 LAB — RESP PANEL BY RT-PCR (RSV, FLU A&B, COVID)  RVPGX2
Influenza A by PCR: NEGATIVE
Influenza B by PCR: NEGATIVE
Resp Syncytial Virus by PCR: NEGATIVE
SARS Coronavirus 2 by RT PCR: NEGATIVE

## 2022-05-19 NOTE — ED Notes (Signed)
Called pt x3 for room, no response. 

## 2022-09-04 ENCOUNTER — Encounter (HOSPITAL_COMMUNITY): Payer: Self-pay

## 2022-09-04 ENCOUNTER — Other Ambulatory Visit: Payer: Self-pay

## 2022-09-04 ENCOUNTER — Emergency Department (HOSPITAL_COMMUNITY)
Admission: EM | Admit: 2022-09-04 | Discharge: 2022-09-04 | Disposition: A | Discharge status: Home / Self Care | Payer: Commercial Managed Care - HMO | Attending: Emergency Medicine | Admitting: Emergency Medicine

## 2022-09-04 ENCOUNTER — Emergency Department (HOSPITAL_COMMUNITY): Payer: Commercial Managed Care - HMO

## 2022-09-04 DIAGNOSIS — R079 Chest pain, unspecified: Secondary | ICD-10-CM | POA: Diagnosis not present

## 2022-09-04 DIAGNOSIS — M545 Low back pain, unspecified: Secondary | ICD-10-CM | POA: Diagnosis not present

## 2022-09-04 DIAGNOSIS — M79605 Pain in left leg: Secondary | ICD-10-CM | POA: Diagnosis not present

## 2022-09-04 DIAGNOSIS — R109 Unspecified abdominal pain: Secondary | ICD-10-CM | POA: Diagnosis present

## 2022-09-04 LAB — CBC WITH DIFFERENTIAL/PLATELET
Abs Immature Granulocytes: 0.05 10*3/uL (ref 0.00–0.07)
Basophils Absolute: 0 10*3/uL (ref 0.0–0.1)
Basophils Relative: 0 %
Eosinophils Absolute: 0 10*3/uL (ref 0.0–0.5)
Eosinophils Relative: 0 %
HCT: 43.3 % (ref 39.0–52.0)
Hemoglobin: 15.3 g/dL (ref 13.0–17.0)
Immature Granulocytes: 1 %
Lymphocytes Relative: 12 %
Lymphs Abs: 1.1 10*3/uL (ref 0.7–4.0)
MCH: 34.8 pg — ABNORMAL HIGH (ref 26.0–34.0)
MCHC: 35.3 g/dL (ref 30.0–36.0)
MCV: 98.4 fL (ref 80.0–100.0)
Monocytes Absolute: 0.8 10*3/uL (ref 0.1–1.0)
Monocytes Relative: 10 %
Neutro Abs: 6.7 10*3/uL (ref 1.7–7.7)
Neutrophils Relative %: 77 %
Platelets: 194 10*3/uL (ref 150–400)
RBC: 4.4 MIL/uL (ref 4.22–5.81)
RDW: 12.2 % (ref 11.5–15.5)
WBC: 8.6 10*3/uL (ref 4.0–10.5)
nRBC: 0 % (ref 0.0–0.2)

## 2022-09-04 LAB — BASIC METABOLIC PANEL
Anion gap: 12 (ref 5–15)
BUN: 12 mg/dL (ref 6–20)
CO2: 21 mmol/L — ABNORMAL LOW (ref 22–32)
Calcium: 8.9 mg/dL (ref 8.9–10.3)
Chloride: 100 mmol/L (ref 98–111)
Creatinine, Ser: 1.16 mg/dL (ref 0.61–1.24)
GFR, Estimated: >60 mL/min (ref >60)
Glucose, Bld: 106 mg/dL — ABNORMAL HIGH (ref 70–99)
Potassium: 4.2 mmol/L (ref 3.5–5.1)
Sodium: 133 mmol/L — ABNORMAL LOW (ref 135–145)

## 2022-09-04 LAB — TROPONIN I (HIGH SENSITIVITY)
Troponin I (High Sensitivity): 4 ng/L (ref <18)
Troponin I (High Sensitivity): 4 ng/L (ref <18)

## 2022-09-04 LAB — CK: Total CK: 89 U/L (ref 49–397)

## 2022-09-04 MED ORDER — FENTANYL CITRATE PF 50 MCG/ML IJ SOSY
50.0000 ug | PREFILLED_SYRINGE | Freq: Once | INTRAMUSCULAR | Status: AC
  Administered 2022-09-04: 50 ug via INTRAVENOUS
  Filled 2022-09-04: qty 1

## 2022-09-04 MED ORDER — KETOROLAC TROMETHAMINE 15 MG/ML IJ SOLN
15.0000 mg | Freq: Once | INTRAMUSCULAR | Status: AC
  Administered 2022-09-04: 15 mg via INTRAVENOUS
  Filled 2022-09-04: qty 1

## 2022-09-04 NOTE — ED Provider Notes (Signed)
[place] EMERGENCY DEPARTMENT AT [facility] Provider Note   CSN: [id number] Arrival date & time: 09/04/22  1158     History  Chief Complaint  Patient presents with   Back Pain    Larry Lester is a 52 y.o. male.   Back Pain Patient presents with left-sided pain.  States woke with this morning.  States it is on left chest left flank left abdomen left buttock and going down the left leg.  States he did have an allergic reaction last night and took some Benadryl.  No dysuria.  States that hurts to move.  Does hurt a little bit to breathe also.    History reviewed. No pertinent past medical history.  Home Medications Prior to Admission medications   Medication Sig Start Date End Date Taking? Authorizing Provider  hydrOXYzine (ATARAX) 25 MG tablet Take 1 tablet (25 mg total) by mouth every 6 (six) hours as needed for itching. 02/05/22   Jeannie Fend, PA-C  ibuprofen (ADVIL) 600 MG tablet Take 1 tablet (600 mg total) by mouth every 6 (six) hours as needed. 09/14/21   Gailen Shelter, PA  methocarbamol (ROBAXIN) 500 MG tablet Take 1 tablet (500 mg total) by mouth 2 (two) times daily. 09/14/21   Gailen Shelter, PA  triamcinolone cream (KENALOG) 0.1 % Apply 1 Application topically 2 (two) times daily. 02/05/22   Jeannie Fend, PA-C      Allergies    Parke Simmers allergy]    Review of Systems   Review of Systems  Musculoskeletal:  Positive for back pain.    Physical Exam Updated Vital Signs BP 118/70 (BP Location: Right Arm)   Pulse 89   Temp 98.9 F (37.2 C) (Oral)   Resp 18   SpO2 100%  Physical Exam Vitals and nursing note reviewed.  HENT:     Head: Normocephalic.  Eyes:     Pupils: Pupils are equal, round, and reactive to light.  Chest:     Chest wall: Tenderness present.  Abdominal:     Tenderness: There is abdominal tenderness.     Comments: Left flank and left abdominal tenderness.  Musculoskeletal:        General: Tenderness present.      Cervical back: Neck supple.     Comments: Tenderness to left lumbar spine left flank left buttock and left lower extremity.  Neurological:     Mental Status: He is alert.     ED Results / Procedures / Treatments   Labs (all labs ordered are listed, but only abnormal results are displayed) Labs Reviewed  CBC WITH DIFFERENTIAL/PLATELET - Abnormal; Notable for the following components:      Result Value   MCH 34.8 (*)    All other components within normal limits  BASIC METABOLIC PANEL - Abnormal; Notable for the following components:   Sodium 133 (*)    CO2 21 (*)    Glucose, Bld 106 (*)    All other components within normal limits  CK  URINALYSIS, ROUTINE W REFLEX MICROSCOPIC  TROPONIN I (HIGH SENSITIVITY)  TROPONIN I (HIGH SENSITIVITY)    EKG None  Radiology DG Chest 2 View  Result Date: 09/04/2022 CLINICAL DATA:  chest pain EXAM: CHEST - 2 VIEW COMPARISON:  08/17/2014 FINDINGS: Cardiac silhouette is unremarkable. No pneumothorax or pleural effusion. The lungs are clear. Aorta calcified. The visualized skeletal structures are unremarkable. IMPRESSION: No acute cardiopulmonary process. Electronically Signed   By: Charlaine Dalton.D.  On: 09/04/2022 17:32    Procedures Procedures    Medications Ordered in ED Medications  fentaNYL (SUBLIMAZE) injection 50 mcg (50 mcg Intravenous Given 09/04/22 1712)  ketorolac (TORADOL) 15 MG/ML injection 15 mg (15 mg Intravenous Given 09/04/22 2049)    ED Course/ Medical Decision Making/ A&P                             Medical Decision Making Amount and/or Complexity of Data Reviewed Labs: ordered. Radiology: ordered.  Risk Prescription drug management.   Patient with chest pain abdominal pain back pain and lower extremity pain.  Began last night.  Did have allergic reaction yesterday.  Patient is hypotensive.  However does appear to have a baseline somewhat low blood pressure.  No dysuria.  Differential diagnoses long  includes musculoskeletal pain cardiac disease intra-abdominal issues UTI.  Will get x-ray basic blood work urinalysis and give some pain medicines.  Sepsis felt less likely but considered.  Blood work reassuring.  No clear cause.  However patient had length of stay about 9 hours and did not want to wait further for his urinalysis.  Feeling somewhat better after treatment.  Discharge home with outpatient follow-up.  Patient requested a work note.        Final Clinical Impression(s) / ED Diagnoses Final diagnoses:  Flank pain    Rx / DC Orders ED Discharge Orders     None         Benjiman Core, MD 09/04/22 2328

## 2022-09-04 NOTE — ED Triage Notes (Addendum)
Pt BIB [facility] from the extended stay c/o lower back pain that started last night. Pt denies any falls or injuries. Pt states the pain runs down to his butt cheek on the left.

## 2022-09-05 ENCOUNTER — Telehealth: Payer: Self-pay | Admitting: *Deleted

## 2022-09-05 ENCOUNTER — Emergency Department (HOSPITAL_COMMUNITY)
Admission: EM | Admit: 2022-09-05 | Discharge: 2022-09-05 | Disposition: A | Discharge status: Home / Self Care | Payer: Commercial Managed Care - HMO | Attending: Emergency Medicine | Admitting: Emergency Medicine

## 2022-09-05 ENCOUNTER — Encounter (HOSPITAL_COMMUNITY): Payer: Self-pay

## 2022-09-05 DIAGNOSIS — M62838 Other muscle spasm: Secondary | ICD-10-CM | POA: Insufficient documentation

## 2022-09-05 DIAGNOSIS — M546 Pain in thoracic spine: Secondary | ICD-10-CM | POA: Diagnosis not present

## 2022-09-05 MED ORDER — IBUPROFEN 600 MG PO TABS: 600.0000 mg | ORAL_TABLET | Freq: Four times a day (QID) | ORAL | 0 refills | Status: DC | PRN

## 2022-09-05 MED ORDER — METHOCARBAMOL 500 MG PO TABS: 500.0000 mg | ORAL_TABLET | Freq: Two times a day (BID) | ORAL | 0 refills | Status: DC

## 2022-09-05 NOTE — Telephone Encounter (Signed)
Pt called regarding pharmacy not receiving Rx as stated on After Visit Summary (AVS).  RNCM advised that Rx was not prescribed on last visit.  Pt needs to revisit  medical facility to be reevaluated for Rx.

## 2022-09-05 NOTE — ED Provider Notes (Signed)
[place] EMERGENCY DEPARTMENT AT [facility] Provider Note   CSN: [id number] Arrival date & time: 09/05/22  1613     History  No chief complaint on file.   Larry Lester is a 52 y.o. male.  The history is provided by the patient and medical records. No language interpreter was used.     52 year old male presenting requesting for muscle relaxer.  Patient states he has had some muscle spasms to the left side of his elbow back to arm for the past 2 days.  He felt most practical be helpful.  He denies any fever chills chest pain shortness of breath or any specific injury.  He was seen in the ED yesterday for similar complaint.  He was noted to have low blood pressure from yesterday visit and workup overall was reassuring and was subsequently was discharged home.  Home Medications Prior to Admission medications   Medication Sig Start Date End Date Taking? Authorizing Provider  hydrOXYzine (ATARAX) 25 MG tablet Take 1 tablet (25 mg total) by mouth every 6 (six) hours as needed for itching. 02/05/22   Jeannie Fend, PA-C  ibuprofen (ADVIL) 600 MG tablet Take 1 tablet (600 mg total) by mouth every 6 (six) hours as needed. 09/14/21   Gailen Shelter, PA  methocarbamol (ROBAXIN) 500 MG tablet Take 1 tablet (500 mg total) by mouth 2 (two) times daily. 09/14/21   Gailen Shelter, PA  triamcinolone cream (KENALOG) 0.1 % Apply 1 Application topically 2 (two) times daily. 02/05/22   Jeannie Fend, PA-C      Allergies    Parke Simmers allergy]    Review of Systems   Review of Systems  All other systems reviewed and are negative.   Physical Exam Updated Vital Signs BP 94/81 (BP Location: Right Arm)   Pulse (!) 104   Temp 97.9 F (36.6 C)   Resp 18   SpO2 99%  Physical Exam Vitals and nursing note reviewed.  Constitutional:      General: He is not in acute distress.    Appearance: He is well-developed.  HENT:     Head: Atraumatic.  Eyes:     Conjunctiva/sclera:  Conjunctivae normal.  Cardiovascular:     Rate and Rhythm: Normal rate and regular rhythm.     Pulses: Normal pulses.     Heart sounds: Normal heart sounds.  Pulmonary:     Breath sounds: Normal breath sounds. No wheezing.  Abdominal:     Palpations: Abdomen is soft.  Musculoskeletal:        General: Tenderness (Some tenderness noted to left upper back on palp patient without any skin changes) present.     Cervical back: Neck supple.  Skin:    Findings: No rash.  Neurological:     Mental Status: He is alert. Mental status is at baseline.     ED Results / Procedures / Treatments   Labs (all labs ordered are listed, but only abnormal results are displayed) Labs Reviewed - No data to display  EKG None  Radiology DG Chest 2 View  Result Date: 09/04/2022 CLINICAL DATA:  chest pain EXAM: CHEST - 2 VIEW COMPARISON:  08/17/2014 FINDINGS: Cardiac silhouette is unremarkable. No pneumothorax or pleural effusion. The lungs are clear. Aorta calcified. The visualized skeletal structures are unremarkable. IMPRESSION: No acute cardiopulmonary process. Electronically Signed   By: Layla Maw M.D.   On: 09/04/2022 17:32    Procedures Procedures    Medications Ordered in  ED Medications - No data to display  ED Course/ Medical Decision Making/ A&P                             Medical Decision Making  BP 94/81 (BP Location: Right Arm)   Pulse (!) 104   Temp 97.9 F (36.6 C)   Resp 18   SpO2 99%    4:29 PM  52 year old male presenting requesting for muscle relaxer.  Patient states he has had some muscle spasms to the left side of his elbow back to arm for the past 2 days.  He felt most practical be helpful.  He denies any fever chills chest pain shortness of breath or any specific injury.  He was seen in the ED yesterday for similar complaint.  He was noted to have low blood pressure from yesterday visit and workup overall was reassuring and was subsequently was discharged  home.  On exam this is a well-appearing male resting comfortably appears to be in no acute discomfort.  He does have some reproducible tenderness noted to upper back on palpation but no overlying skin changes.  No abdominal tenderness heart with normal rate and rhythm, lungs are clear to auscultation bilaterally  Vital signs notable for blood pressure of 94/81 and heart rate 104.  However patient is afebrile and no hypoxia.  On recheck, improved blood pressure.  Patient does not have any urinary symptoms and does not have any infectious symptoms at this time.  After further discussion, will discharge home with muscle relaxant but patient gave return precaution if symptoms worsen.  I have considered repeat labs and obtain chest x-ray and UA but patient preference is to receive muscle relaxant only.        Final Clinical Impression(s) / ED Diagnoses Final diagnoses:  Muscle spasm    Rx / DC Orders ED Discharge Orders          Ordered    methocarbamol (ROBAXIN) 500 MG tablet  2 times daily        09/05/22 1723    ibuprofen (ADVIL) 600 MG tablet  Every 6 hours PRN        09/05/22 1723              Fayrene Helper, PA-C 09/05/22 1723    Cathren Laine, MD 09/05/22 2216

## 2022-09-05 NOTE — ED Notes (Signed)
Patient verbalizes understanding of discharge instructions. Opportunity for questioning and answers were provided. Pt discharged from ED. 

## 2022-09-05 NOTE — ED Triage Notes (Addendum)
Pt states he needs a refill of his muscle relaxer; endorses L flank pain and neck pain; was seen yesterday for same

## 2022-09-09 ENCOUNTER — Emergency Department (HOSPITAL_COMMUNITY)
Admission: EM | Admit: 2022-09-09 | Discharge: 2022-09-09 | Disposition: A | Discharge status: Home / Self Care | Payer: Commercial Managed Care - HMO | Attending: Emergency Medicine | Admitting: Emergency Medicine

## 2022-09-09 ENCOUNTER — Encounter (HOSPITAL_COMMUNITY): Payer: Self-pay

## 2022-09-09 DIAGNOSIS — E876 Hypokalemia: Secondary | ICD-10-CM | POA: Insufficient documentation

## 2022-09-09 DIAGNOSIS — M549 Dorsalgia, unspecified: Secondary | ICD-10-CM | POA: Insufficient documentation

## 2022-09-09 DIAGNOSIS — R0789 Other chest pain: Secondary | ICD-10-CM | POA: Insufficient documentation

## 2022-09-09 DIAGNOSIS — R1084 Generalized abdominal pain: Secondary | ICD-10-CM | POA: Diagnosis not present

## 2022-09-09 DIAGNOSIS — R718 Other abnormality of red blood cells: Secondary | ICD-10-CM | POA: Insufficient documentation

## 2022-09-09 DIAGNOSIS — R252 Cramp and spasm: Secondary | ICD-10-CM | POA: Insufficient documentation

## 2022-09-09 DIAGNOSIS — E872 Acidosis, unspecified: Secondary | ICD-10-CM | POA: Diagnosis not present

## 2022-09-09 DIAGNOSIS — Z720 Tobacco use: Secondary | ICD-10-CM | POA: Diagnosis not present

## 2022-09-09 LAB — CBC WITH DIFFERENTIAL/PLATELET
Abs Immature Granulocytes: 0.08 10*3/uL — ABNORMAL HIGH (ref 0.00–0.07)
Basophils Absolute: 0 10*3/uL (ref 0.0–0.1)
Basophils Relative: 0 %
Eosinophils Absolute: 0 10*3/uL (ref 0.0–0.5)
Eosinophils Relative: 0 %
HCT: 36.7 % — ABNORMAL LOW (ref 39.0–52.0)
Hemoglobin: 13.1 g/dL (ref 13.0–17.0)
Immature Granulocytes: 1 %
Lymphocytes Relative: 20 %
Lymphs Abs: 1.9 10*3/uL (ref 0.7–4.0)
MCH: 34.5 pg — ABNORMAL HIGH (ref 26.0–34.0)
MCHC: 35.7 g/dL (ref 30.0–36.0)
MCV: 96.6 fL (ref 80.0–100.0)
Monocytes Absolute: 1.1 10*3/uL — ABNORMAL HIGH (ref 0.1–1.0)
Monocytes Relative: 12 %
Neutro Abs: 6.2 10*3/uL (ref 1.7–7.7)
Neutrophils Relative %: 67 %
Platelets: 310 10*3/uL (ref 150–400)
RBC: 3.8 MIL/uL — ABNORMAL LOW (ref 4.22–5.81)
RDW: 12.3 % (ref 11.5–15.5)
WBC: 9.2 10*3/uL (ref 4.0–10.5)
nRBC: 0 % (ref 0.0–0.2)

## 2022-09-09 LAB — BASIC METABOLIC PANEL
Anion gap: 17 — ABNORMAL HIGH (ref 5–15)
BUN: 10 mg/dL (ref 6–20)
CO2: 22 mmol/L (ref 22–32)
Calcium: 8.8 mg/dL — ABNORMAL LOW (ref 8.9–10.3)
Chloride: 96 mmol/L — ABNORMAL LOW (ref 98–111)
Creatinine, Ser: 1.07 mg/dL (ref 0.61–1.24)
GFR, Estimated: >60 mL/min (ref >60)
Glucose, Bld: 115 mg/dL — ABNORMAL HIGH (ref 70–99)
Potassium: 3.3 mmol/L — ABNORMAL LOW (ref 3.5–5.1)
Sodium: 135 mmol/L (ref 135–145)

## 2022-09-09 LAB — CK: Total CK: 74 U/L (ref 49–397)

## 2022-09-09 LAB — MAGNESIUM: Magnesium: 2 mg/dL (ref 1.7–2.4)

## 2022-09-09 MED ORDER — KETOROLAC TROMETHAMINE 30 MG/ML IJ SOLN
30.0000 mg | Freq: Once | INTRAMUSCULAR | Status: AC
  Administered 2022-09-09: 30 mg via INTRAVENOUS
  Filled 2022-09-09: qty 1

## 2022-09-09 MED ORDER — POTASSIUM CHLORIDE CRYS ER 20 MEQ PO TBCR: 20.0000 meq | EXTENDED_RELEASE_TABLET | Freq: Two times a day (BID) | ORAL | 0 refills | Status: DC

## 2022-09-09 MED ORDER — METHOCARBAMOL 500 MG PO TABS
1000.0000 mg | ORAL_TABLET | Freq: Once | ORAL | Status: AC
  Administered 2022-09-09: 1000 mg via ORAL
  Filled 2022-09-09: qty 2

## 2022-09-09 MED ORDER — POTASSIUM CHLORIDE CRYS ER 20 MEQ PO TBCR
40.0000 meq | EXTENDED_RELEASE_TABLET | Freq: Once | ORAL | Status: AC
  Administered 2022-09-09: 40 meq via ORAL
  Filled 2022-09-09: qty 2

## 2022-09-09 MED ORDER — SODIUM CHLORIDE 0.9 % IV BOLUS
1000.0000 mL | Freq: Once | INTRAVENOUS | Status: AC
  Administered 2022-09-09: 1000 mL via INTRAVENOUS

## 2022-09-09 NOTE — ED Triage Notes (Signed)
Pt BIBA from home. Pt c/o back pain that has now radiates throughout his body. No relief with meds at home.

## 2022-09-09 NOTE — Discharge Instructions (Addendum)
Thank you for allowing me to be a part of your care today.    Your work-up today suggests you may have some electrolyte imbalances and dehydration.  I recommend increasing you water intake and including some electrolyte drinks such as Gatorade, Powerade, Body Armor, etc.    I recommend increasing your potassium and calcium intake in your diet.  I have included information about this.  I have sent over a short prescription for potassium to help replenish this.   I have provided information for [facility].  This is a resource for primary care for patients who are uninsured or underinsured.  I strongly encourage you to schedule a follow up appointment with them.   Continue to take your muscle relaxants as prescribed.  I also recommend taking ibuprofen and Tylenol as needed for pain.   Return to the ED if you experience sudden worsening of your symptoms or if you have any new concerns.

## 2022-09-09 NOTE — ED Notes (Signed)
Pt ambulated to restroom without incident.

## 2022-09-09 NOTE — ED Provider Notes (Signed)
[place] EMERGENCY DEPARTMENT AT [facility] Provider Note   CSN: [id number] Arrival date & time: 09/09/22  1153     History  Chief Complaint  Patient presents with   Back Pain    Larry Lester is a 52 y.o. male presents to the ED complaining of back pain all over pain.  He reports that it initially started in his back, but now he has muscular spasms and pain all over his body.  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  He has been seen in the ED recently for similar symptoms.  He reports some numbness to his extremities as well.  Denies fever, chest pain, shortness of breath, nausea, vomiting, dizziness, difficulty speaking or swallowing, weakness, tremors, lightheadedness, headache, facial droop, syncope.  Denies injuries or falls.         Home Medications Prior to Admission medications   Medication Sig Start Date End Date Taking? Authorizing Provider  potassium chloride SA (KLOR-CON M) 20 MEQ tablet Take 1 tablet (20 mEq total) by mouth 2 (two) times daily. 09/09/22  Yes Markise Haymer R, PA-C  hydrOXYzine (ATARAX) 25 MG tablet Take 1 tablet (25 mg total) by mouth every 6 (six) hours as needed for itching. 02/05/22   Jeannie Fend, PA-C  ibuprofen (ADVIL) 600 MG tablet Take 1 tablet (600 mg total) by mouth every 6 (six) hours as needed. 09/05/22   Fayrene Helper, PA-C  methocarbamol (ROBAXIN) 500 MG tablet Take 1 tablet (500 mg total) by mouth 2 (two) times daily. 09/05/22   Fayrene Helper, PA-C  triamcinolone cream (KENALOG) 0.1 % Apply 1 Application topically 2 (two) times daily. 02/05/22   Jeannie Fend, PA-C      Allergies    Parke Simmers allergy]    Review of Systems   Review of Systems  Constitutional:  Negative for fever.  Respiratory:  Negative for shortness of breath.   Cardiovascular:  Negative for chest pain.  Gastrointestinal:  Positive for abdominal pain. Negative for nausea and vomiting.   Musculoskeletal:  Positive for back pain and myalgias. Negative for arthralgias.  Neurological:  Positive for numbness. Negative for dizziness, tremors, syncope, facial asymmetry, speech difficulty, weakness, light-headedness and headaches.    Physical Exam Updated Vital Signs BP 128/68 (BP Location: Right Arm)   Pulse 68   Temp 97.7 F (36.5 C)   Resp 16   Ht 5' 6.5" (1.689 m)   Wt 88.5 kg   BMI 31.00 kg/m  Physical Exam Vitals and nursing note reviewed.  Constitutional:      General: He is not in acute distress.    Appearance: Normal appearance. He is not ill-appearing or diaphoretic.  HENT:     Head: Normocephalic and atraumatic.  Cardiovascular:     Rate and Rhythm: Normal rate and regular rhythm.  Pulmonary:     Effort: Pulmonary effort is normal. No tachypnea or respiratory distress.  Chest:     Chest wall: Tenderness present. No swelling or crepitus.     Comments: Generalized tenderness to palpation of anterior chest with left side tenderness greater than right.   Abdominal:     General: Abdomen is flat.     Palpations: Abdomen is soft.     Tenderness: There is generalized abdominal tenderness.  Musculoskeletal:     Right lower leg: No edema.     Left lower leg: No edema.     Comments:  Generalized tenderness to palpation of entire back along the paraspinal muscles, upper and lower extremities.  Patient complains of pain with light palpation.    Skin:    General: Skin is warm and dry.     Capillary Refill: Capillary refill takes less than 2 seconds.  Neurological:     Mental Status: He is alert. Mental status is at baseline.  Psychiatric:        Mood and Affect: Mood normal.        Behavior: Behavior normal.     ED Results / Procedures / Treatments   Labs (all labs ordered are listed, but only abnormal results are displayed) Labs Reviewed  BASIC METABOLIC PANEL - Abnormal; Notable for the following components:      Result Value   Potassium 3.3 (*)     Chloride 96 (*)    Glucose, Bld 115 (*)    Calcium 8.8 (*)    Anion gap 17 (*)    All other components within normal limits  CBC WITH DIFFERENTIAL/PLATELET - Abnormal; Notable for the following components:   RBC 3.80 (*)    HCT 36.7 (*)    MCH 34.5 (*)    Monocytes Absolute 1.1 (*)    Abs Immature Granulocytes 0.08 (*)    All other components within normal limits  MAGNESIUM  CK    EKG None  Radiology No results found.  Procedures Procedures    Medications Ordered in ED Medications  methocarbamol (ROBAXIN) tablet 1,000 mg (1,000 mg Oral Given 09/09/22 1241)  sodium chloride 0.9 % bolus 1,000 mL (0 mLs Intravenous Stopped 09/09/22 1427)  potassium chloride SA (KLOR-CON M) CR tablet 40 mEq (40 mEq Oral Given 09/09/22 1352)  ketorolac (TORADOL) 30 MG/ML injection 30 mg (30 mg Intravenous Given 09/09/22 1542)    ED Course/ Medical Decision Making/ A&P                             Medical Decision Making Amount and/or Complexity of Data Reviewed Labs: ordered.  Risk Prescription drug management.   This patient presents to the ED with chief complaint(s) of all over pain and muscle spasms with pertinent past medical history of tobacco use.  The complaint involves an extensive differential diagnosis and also carries with it a high risk of complications and morbidity.    The differential diagnosis includes muscular spasms, dehydration, electrolyte derangement, musculoskeletal strain/sprain, polymyositis, toxic ingestion, infection   The initial plan is to obtain baseline labs, give IV fluids  Additional history obtained: Records reviewed  - patient has been seen twice in ED for similar symptoms.  He has not been evaluated by primary care stating he does not have primary care due to insurance.    Initial Assessment:   On exam, patient is unable to sit still and appears to be very uncomfortable.  He is A&Ox3.  He has pain with palpation to all extremities, entire back, anterior  chest, and abdomen.  Muscular spasms appreciated in the back.  No instability in the spine.  5/5 strength in bilateral upper and lower extremities with intact sensation.  CN 2-12 grossly intact.  No focal deficits.  Skin is warm and dry.  Patient is requesting if he can eat.    Independent ECG/labs interpretation:  The following labs were independently interpreted:  CBC with mild erythropenia, but normal hemoglobin.  No leukocytosis.  Metabolic panel with mild hypokalemia and hypocalcemia.  Mildly elevated anion gap,  could be secondary to dehydration   Treatment and Reassessment: Patient given IV fluids and Robaxin with improvement in his symptoms.  He was found to be mildly hypokalemic, will give PO potassium.    Patient was able to ambulate without assistance to the bathroom.  He did have some improvement in his symptoms.    Disposition:   I believe that patient is appropriate for discharge home with primary care follow-up.  Provided patient with information for the [facility]ness center as he does not have insurance and needs primary care.  Advised patient to schedule an appointment as soon as possible.  Sent short course of potassium to patient's pharmacy.  Provided information regarding increasing potassium and calcium intake as well as increasing water.  I suspect patient may be experiencing dehydration which can contribute to full body muscle cramping.    The patient has been appropriately medically screened and/or stabilized in the ED. I have low suspicion for any other emergent medical condition which would require further screening, evaluation or treatment in the ED or require inpatient management. At time of discharge the patient is hemodynamically stable and in no acute distress. I have discussed work-up results and diagnosis with patient and answered all questions. Patient is agreeable with discharge plan. We discussed strict return precautions for returning to  the emergency department and they verbalized understanding.    Social Determinants of Health:   Patient's lack of insurance  increases the complexity of managing their presentation         Final Clinical Impression(s) / ED Diagnoses Final diagnoses:  Muscle cramps    Rx / DC Orders ED Discharge Orders          Ordered    potassium chloride SA (KLOR-CON M) 20 MEQ tablet  2 times daily        09/09/22 1528              Lenard Simmer, [place] 09/09/22 1547    Terald Sleeper, MD 09/09/22 1625

## 2022-09-26 ENCOUNTER — Emergency Department (HOSPITAL_COMMUNITY): Payer: Commercial Managed Care - HMO

## 2022-09-26 ENCOUNTER — Inpatient Hospital Stay (HOSPITAL_COMMUNITY)
Admission: EM | Admit: 2022-09-26 | Discharge: 2022-10-02 | DRG: 539 | Disposition: A | Discharge status: Home / Self Care | Payer: Commercial Managed Care - HMO | Attending: Internal Medicine | Admitting: Internal Medicine

## 2022-09-26 ENCOUNTER — Inpatient Hospital Stay (HOSPITAL_COMMUNITY)
Admission: EM | Disposition: A | Discharge status: Home / Self Care | Payer: Self-pay | Source: Home / Self Care | Attending: Internal Medicine

## 2022-09-26 ENCOUNTER — Other Ambulatory Visit: Payer: Self-pay

## 2022-09-26 ENCOUNTER — Emergency Department (HOSPITAL_COMMUNITY): Payer: Commercial Managed Care - HMO | Admitting: Anesthesiology

## 2022-09-26 ENCOUNTER — Encounter (HOSPITAL_COMMUNITY): Payer: Self-pay | Admitting: *Deleted

## 2022-09-26 DIAGNOSIS — D649 Anemia, unspecified: Secondary | ICD-10-CM | POA: Diagnosis not present

## 2022-09-26 DIAGNOSIS — K0889 Other specified disorders of teeth and supporting structures: Secondary | ICD-10-CM

## 2022-09-26 DIAGNOSIS — M4622 Osteomyelitis of vertebra, cervical region: Secondary | ICD-10-CM | POA: Diagnosis present

## 2022-09-26 DIAGNOSIS — D638 Anemia in other chronic diseases classified elsewhere: Secondary | ICD-10-CM | POA: Diagnosis present

## 2022-09-26 DIAGNOSIS — F1490 Cocaine use, unspecified, uncomplicated: Secondary | ICD-10-CM | POA: Diagnosis not present

## 2022-09-26 DIAGNOSIS — R296 Repeated falls: Secondary | ICD-10-CM | POA: Diagnosis present

## 2022-09-26 DIAGNOSIS — Z8249 Family history of ischemic heart disease and other diseases of the circulatory system: Secondary | ICD-10-CM | POA: Diagnosis not present

## 2022-09-26 DIAGNOSIS — L0211 Cutaneous abscess of neck: Secondary | ICD-10-CM | POA: Diagnosis present

## 2022-09-26 DIAGNOSIS — F17290 Nicotine dependence, other tobacco product, uncomplicated: Secondary | ICD-10-CM | POA: Diagnosis present

## 2022-09-26 DIAGNOSIS — Y92012 Bathroom of single-family (private) house as the place of occurrence of the external cause: Secondary | ICD-10-CM | POA: Diagnosis not present

## 2022-09-26 DIAGNOSIS — Z833 Family history of diabetes mellitus: Secondary | ICD-10-CM | POA: Diagnosis not present

## 2022-09-26 DIAGNOSIS — M5412 Radiculopathy, cervical region: Secondary | ICD-10-CM | POA: Diagnosis present

## 2022-09-26 DIAGNOSIS — Z91013 Allergy to seafood: Secondary | ICD-10-CM | POA: Diagnosis not present

## 2022-09-26 DIAGNOSIS — M4642 Discitis, unspecified, cervical region: Secondary | ICD-10-CM | POA: Diagnosis present

## 2022-09-26 DIAGNOSIS — F1410 Cocaine abuse, uncomplicated: Secondary | ICD-10-CM | POA: Diagnosis present

## 2022-09-26 DIAGNOSIS — G9519 Other vascular myelopathies: Secondary | ICD-10-CM | POA: Diagnosis present

## 2022-09-26 DIAGNOSIS — W19XXXA Unspecified fall, initial encounter: Principal | ICD-10-CM

## 2022-09-26 DIAGNOSIS — B953 Streptococcus pneumoniae as the cause of diseases classified elsewhere: Secondary | ICD-10-CM | POA: Diagnosis present

## 2022-09-26 DIAGNOSIS — M4802 Spinal stenosis, cervical region: Secondary | ICD-10-CM | POA: Diagnosis present

## 2022-09-26 DIAGNOSIS — W182XXA Fall in (into) shower or empty bathtub, initial encounter: Secondary | ICD-10-CM | POA: Diagnosis present

## 2022-09-26 DIAGNOSIS — Z87891 Personal history of nicotine dependence: Secondary | ICD-10-CM

## 2022-09-26 DIAGNOSIS — F1990 Other psychoactive substance use, unspecified, uncomplicated: Secondary | ICD-10-CM | POA: Insufficient documentation

## 2022-09-26 HISTORY — PX: ANTERIOR CERVICAL DECOMP/DISCECTOMY FUSION: SHX1161

## 2022-09-26 LAB — COMPREHENSIVE METABOLIC PANEL
ALT: 17 U/L (ref 0–44)
AST: 24 U/L (ref 15–41)
Albumin: 3 g/dL — ABNORMAL LOW (ref 3.5–5.0)
Alkaline Phosphatase: 54 U/L (ref 38–126)
Anion gap: 11 (ref 5–15)
BUN: 12 mg/dL (ref 6–20)
CO2: 19 mmol/L — ABNORMAL LOW (ref 22–32)
Calcium: 8.9 mg/dL (ref 8.9–10.3)
Chloride: 102 mmol/L (ref 98–111)
Creatinine, Ser: 0.91 mg/dL (ref 0.61–1.24)
GFR, Estimated: >60 mL/min (ref >60)
Glucose, Bld: 84 mg/dL (ref 70–99)
Potassium: 3.6 mmol/L (ref 3.5–5.1)
Sodium: 132 mmol/L — ABNORMAL LOW (ref 135–145)
Total Bilirubin: 0.7 mg/dL (ref 0.3–1.2)
Total Protein: 8.7 g/dL — ABNORMAL HIGH (ref 6.5–8.1)

## 2022-09-26 LAB — CBC WITH DIFFERENTIAL/PLATELET
Abs Immature Granulocytes: 0.02 10*3/uL (ref 0.00–0.07)
Basophils Absolute: 0 10*3/uL (ref 0.0–0.1)
Basophils Relative: 0 %
Eosinophils Absolute: 0 10*3/uL (ref 0.0–0.5)
Eosinophils Relative: 0 %
HCT: 33.6 % — ABNORMAL LOW (ref 39.0–52.0)
Hemoglobin: 11.4 g/dL — ABNORMAL LOW (ref 13.0–17.0)
Immature Granulocytes: 0 %
Lymphocytes Relative: 22 %
Lymphs Abs: 1.7 10*3/uL (ref 0.7–4.0)
MCH: 33.8 pg (ref 26.0–34.0)
MCHC: 33.9 g/dL (ref 30.0–36.0)
MCV: 99.7 fL (ref 80.0–100.0)
Monocytes Absolute: 0.8 10*3/uL (ref 0.1–1.0)
Monocytes Relative: 10 %
Neutro Abs: 5.3 10*3/uL (ref 1.7–7.7)
Neutrophils Relative %: 68 %
Platelets: 435 10*3/uL — ABNORMAL HIGH (ref 150–400)
RBC: 3.37 MIL/uL — ABNORMAL LOW (ref 4.22–5.81)
RDW: 12.4 % (ref 11.5–15.5)
WBC: 7.9 10*3/uL (ref 4.0–10.5)
nRBC: 0 % (ref 0.0–0.2)

## 2022-09-26 LAB — LACTIC ACID, PLASMA: Lactic Acid, Venous: 0.8 mmol/L (ref 0.5–1.9)

## 2022-09-26 LAB — SURGICAL PCR SCREEN
MRSA, PCR: NEGATIVE
Staphylococcus aureus: NEGATIVE

## 2022-09-26 LAB — PROTIME-INR
INR: 1.1 (ref 0.8–1.2)
Prothrombin Time: 13.9 seconds (ref 11.4–15.2)

## 2022-09-26 LAB — APTT: aPTT: 32 seconds (ref 24–36)

## 2022-09-26 LAB — AEROBIC/ANAEROBIC CULTURE W GRAM STAIN (SURGICAL/DEEP WOUND)

## 2022-09-26 SURGERY — ANTERIOR CERVICAL DECOMPRESSION/DISCECTOMY FUSION 1 LEVEL
Anesthesia: General

## 2022-09-26 MED ORDER — 0.9 % SODIUM CHLORIDE (POUR BTL) OPTIME
TOPICAL | Status: DC | PRN
  Administered 2022-09-26: 1000 mL

## 2022-09-26 MED ORDER — PROPOFOL 10 MG/ML IV BOLUS
INTRAVENOUS | Status: AC
  Filled 2022-09-26: qty 20

## 2022-09-26 MED ORDER — LIDOCAINE-EPINEPHRINE 0.5 %-1:200000 IJ SOLN
INTRAMUSCULAR | Status: AC
  Filled 2022-09-26: qty 50

## 2022-09-26 MED ORDER — SUCCINYLCHOLINE CHLORIDE 200 MG/10ML IV SOSY
PREFILLED_SYRINGE | INTRAVENOUS | Status: AC
  Filled 2022-09-26: qty 10

## 2022-09-26 MED ORDER — DIAZEPAM 5 MG PO TABS: 5.0000 mg | ORAL_TABLET | Freq: Four times a day (QID) | ORAL | Status: DC | PRN

## 2022-09-26 MED ORDER — LIDOCAINE 2% (20 MG/ML) 5 ML SYRINGE
INTRAMUSCULAR | Status: DC | PRN
  Administered 2022-09-26: 60 mg via INTRAVENOUS

## 2022-09-26 MED ORDER — ONDANSETRON HCL 4 MG/2ML IJ SOLN: 4.0000 mg | Freq: Four times a day (QID) | INTRAMUSCULAR | Status: DC | PRN

## 2022-09-26 MED ORDER — SODIUM CHLORIDE 0.9 % IV SOLN: 250.0000 mL | INTRAVENOUS | Status: DC

## 2022-09-26 MED ORDER — CHLORHEXIDINE GLUCONATE 0.12 % MT SOLN: 15.0000 mL | Freq: Once | OROMUCOSAL | Status: AC

## 2022-09-26 MED ORDER — HYDROMORPHONE HCL 1 MG/ML IJ SOLN
INTRAMUSCULAR | Status: AC
  Filled 2022-09-26: qty 1

## 2022-09-26 MED ORDER — HYDROMORPHONE HCL 1 MG/ML IJ SOLN
0.5000 mg | INTRAMUSCULAR | Status: DC | PRN
  Administered 2022-09-26: 0.5 mg via INTRAVENOUS
  Filled 2022-09-26: qty 1

## 2022-09-26 MED ORDER — SENNOSIDES-DOCUSATE SODIUM 8.6-50 MG PO TABS
1.0000 | ORAL_TABLET | Freq: Every day | ORAL | Status: DC
  Administered 2022-09-26 – 2022-10-01 (×6): 1 via ORAL
  Filled 2022-09-26 (×6): qty 1

## 2022-09-26 MED ORDER — LACTATED RINGERS IV SOLN: INTRAVENOUS | Status: DC | PRN

## 2022-09-26 MED ORDER — LACTATED RINGERS IV SOLN: INTRAVENOUS | Status: DC

## 2022-09-26 MED ORDER — MIDAZOLAM HCL 2 MG/2ML IJ SOLN
INTRAMUSCULAR | Status: AC
  Filled 2022-09-26: qty 2

## 2022-09-26 MED ORDER — CELECOXIB 200 MG PO CAPS
200.0000 mg | ORAL_CAPSULE | Freq: Two times a day (BID) | ORAL | Status: DC
  Administered 2022-09-26 – 2022-10-02 (×13): 200 mg via ORAL
  Filled 2022-09-26 (×13): qty 1

## 2022-09-26 MED ORDER — PHENYLEPHRINE 80 MCG/ML (10ML) SYRINGE FOR IV PUSH (FOR BLOOD PRESSURE SUPPORT)
PREFILLED_SYRINGE | INTRAVENOUS | Status: DC | PRN
  Administered 2022-09-26 (×7): 80 ug via INTRAVENOUS

## 2022-09-26 MED ORDER — SUCCINYLCHOLINE CHLORIDE 200 MG/10ML IV SOSY
PREFILLED_SYRINGE | INTRAVENOUS | Status: DC | PRN
  Administered 2022-09-26: 100 mg via INTRAVENOUS

## 2022-09-26 MED ORDER — ONDANSETRON HCL 4 MG/2ML IJ SOLN
INTRAMUSCULAR | Status: DC | PRN
  Administered 2022-09-26: 4 mg via INTRAVENOUS

## 2022-09-26 MED ORDER — DEXAMETHASONE SODIUM PHOSPHATE 10 MG/ML IJ SOLN
INTRAMUSCULAR | Status: DC | PRN
  Administered 2022-09-26: 10 mg via INTRAVENOUS

## 2022-09-26 MED ORDER — VANCOMYCIN HCL 1500 MG/300ML IV SOLN
1500.0000 mg | Freq: Once | INTRAVENOUS | Status: AC
  Administered 2022-09-26: 1500 mg via INTRAVENOUS
  Filled 2022-09-26 (×2): qty 300

## 2022-09-26 MED ORDER — GADOBUTROL 1 MMOL/ML IV SOLN
8.0000 mL | Freq: Once | INTRAVENOUS | Status: AC | PRN
  Administered 2022-09-26: 8 mL via INTRAVENOUS

## 2022-09-26 MED ORDER — CHLORHEXIDINE GLUCONATE 0.12 % MT SOLN
OROMUCOSAL | Status: AC
  Administered 2022-09-26: 15 mL via OROMUCOSAL
  Filled 2022-09-26: qty 15

## 2022-09-26 MED ORDER — POTASSIUM CHLORIDE IN NACL 20-0.9 MEQ/L-% IV SOLN
INTRAVENOUS | Status: DC
  Filled 2022-09-26 (×2): qty 1000

## 2022-09-26 MED ORDER — SODIUM CHLORIDE 0.9% FLUSH: 3.0000 mL | INTRAVENOUS | Status: DC | PRN

## 2022-09-26 MED ORDER — FENTANYL CITRATE (PF) 250 MCG/5ML IJ SOLN
INTRAMUSCULAR | Status: AC
  Filled 2022-09-26: qty 5

## 2022-09-26 MED ORDER — PROMETHAZINE HCL 25 MG/ML IJ SOLN: 6.2500 mg | INTRAMUSCULAR | Status: DC | PRN

## 2022-09-26 MED ORDER — ACETAMINOPHEN 325 MG PO TABS: 650.0000 mg | ORAL_TABLET | Freq: Four times a day (QID) | ORAL | Status: DC | PRN

## 2022-09-26 MED ORDER — VANCOMYCIN HCL 750 MG/150ML IV SOLN
750.0000 mg | Freq: Two times a day (BID) | INTRAVENOUS | Status: DC
  Administered 2022-09-27: 750 mg via INTRAVENOUS
  Filled 2022-09-26: qty 150

## 2022-09-26 MED ORDER — ACETAMINOPHEN 650 MG RE SUPP: 650.0000 mg | Freq: Four times a day (QID) | RECTAL | Status: DC | PRN

## 2022-09-26 MED ORDER — IBUPROFEN 600 MG PO TABS: 600.0000 mg | ORAL_TABLET | Freq: Four times a day (QID) | ORAL | Status: DC | PRN

## 2022-09-26 MED ORDER — ROCURONIUM BROMIDE 10 MG/ML (PF) SYRINGE
PREFILLED_SYRINGE | INTRAVENOUS | Status: DC | PRN
  Administered 2022-09-26: 10 mg via INTRAVENOUS
  Administered 2022-09-26: 70 mg via INTRAVENOUS
  Administered 2022-09-26: 30 mg via INTRAVENOUS

## 2022-09-26 MED ORDER — OXYCODONE HCL 5 MG PO TABS: 10.0000 mg | ORAL_TABLET | ORAL | Status: DC | PRN

## 2022-09-26 MED ORDER — POLYETHYLENE GLYCOL 3350 17 G PO PACK: 17.0000 g | PACK | Freq: Every day | ORAL | Status: DC | PRN

## 2022-09-26 MED ORDER — ACETAMINOPHEN 650 MG RE SUPP: 650.0000 mg | RECTAL | Status: DC | PRN

## 2022-09-26 MED ORDER — SCOPOLAMINE 1 MG/3DAYS TD PT72
MEDICATED_PATCH | TRANSDERMAL | Status: AC
  Filled 2022-09-26: qty 1

## 2022-09-26 MED ORDER — ONDANSETRON HCL 4 MG/2ML IJ SOLN
INTRAMUSCULAR | Status: AC
  Filled 2022-09-26: qty 2

## 2022-09-26 MED ORDER — PROPOFOL 10 MG/ML IV BOLUS
INTRAVENOUS | Status: DC | PRN
  Administered 2022-09-26: 130 mg via INTRAVENOUS

## 2022-09-26 MED ORDER — HYDROMORPHONE HCL 1 MG/ML IJ SOLN
0.2500 mg | INTRAMUSCULAR | Status: DC | PRN
  Administered 2022-09-26 (×2): 0.5 mg via INTRAVENOUS

## 2022-09-26 MED ORDER — METHOCARBAMOL 1000 MG/10ML IJ SOLN
500.0000 mg | Freq: Four times a day (QID) | INTRAVENOUS | Status: DC | PRN
  Administered 2022-09-28: 500 mg via INTRAVENOUS
  Filled 2022-09-26: qty 500

## 2022-09-26 MED ORDER — DEXAMETHASONE SODIUM PHOSPHATE 10 MG/ML IJ SOLN
INTRAMUSCULAR | Status: AC
  Filled 2022-09-26: qty 1

## 2022-09-26 MED ORDER — OXYCODONE HCL 5 MG PO TABS: 5.0000 mg | ORAL_TABLET | ORAL | Status: DC | PRN

## 2022-09-26 MED ORDER — THROMBIN 5000 UNITS EX SOLR
CUTANEOUS | Status: AC
  Filled 2022-09-26: qty 10000

## 2022-09-26 MED ORDER — HYDROMORPHONE HCL 1 MG/ML IJ SOLN: 1.0000 mg | INTRAMUSCULAR | Status: DC | PRN

## 2022-09-26 MED ORDER — HEPARIN SODIUM (PORCINE) 5000 UNIT/ML IJ SOLN
5000.0000 [IU] | Freq: Three times a day (TID) | INTRAMUSCULAR | Status: DC
  Administered 2022-09-26 – 2022-09-30 (×12): 5000 [IU] via SUBCUTANEOUS
  Filled 2022-09-26 (×12): qty 1

## 2022-09-26 MED ORDER — PHENOL 1.4 % MT LIQD: 1.0000 | OROMUCOSAL | Status: DC | PRN

## 2022-09-26 MED ORDER — MEPERIDINE HCL 25 MG/ML IJ SOLN: 6.2500 mg | INTRAMUSCULAR | Status: DC | PRN

## 2022-09-26 MED ORDER — MIDAZOLAM HCL 2 MG/2ML IJ SOLN
INTRAMUSCULAR | Status: DC | PRN
  Administered 2022-09-26: 2 mg via INTRAVENOUS

## 2022-09-26 MED ORDER — FENTANYL CITRATE (PF) 250 MCG/5ML IJ SOLN
INTRAMUSCULAR | Status: DC | PRN
  Administered 2022-09-26 (×3): 50 ug via INTRAVENOUS
  Administered 2022-09-26: 100 ug via INTRAVENOUS

## 2022-09-26 MED ORDER — SODIUM CHLORIDE 0.9 % IV SOLN
2.0000 g | INTRAVENOUS | Status: DC
  Administered 2022-09-26 – 2022-09-29 (×4): 2 g via INTRAVENOUS
  Filled 2022-09-26 (×4): qty 20

## 2022-09-26 MED ORDER — ACETAMINOPHEN 325 MG PO TABS
650.0000 mg | ORAL_TABLET | ORAL | Status: DC | PRN
  Administered 2022-09-28 – 2022-10-02 (×2): 650 mg via ORAL
  Filled 2022-09-26 (×2): qty 2

## 2022-09-26 MED ORDER — SUGAMMADEX SODIUM 200 MG/2ML IV SOLN
INTRAVENOUS | Status: DC | PRN
  Administered 2022-09-26: 200 mg via INTRAVENOUS

## 2022-09-26 MED ORDER — OXYCODONE HCL ER 10 MG PO T12A
10.0000 mg | EXTENDED_RELEASE_TABLET | Freq: Two times a day (BID) | ORAL | Status: DC
  Administered 2022-09-26 – 2022-09-27 (×3): 10 mg via ORAL
  Filled 2022-09-26 (×3): qty 1

## 2022-09-26 MED ORDER — HYDROXYZINE HCL 25 MG PO TABS: 25.0000 mg | ORAL_TABLET | Freq: Four times a day (QID) | ORAL | Status: DC | PRN

## 2022-09-26 MED ORDER — AMISULPRIDE (ANTIEMETIC) 5 MG/2ML IV SOLN: 10.0000 mg | Freq: Once | INTRAVENOUS | Status: DC | PRN

## 2022-09-26 MED ORDER — LIDOCAINE 2% (20 MG/ML) 5 ML SYRINGE
INTRAMUSCULAR | Status: AC
  Filled 2022-09-26: qty 5

## 2022-09-26 MED ORDER — MENTHOL 3 MG MT LOZG: 1.0000 | LOZENGE | OROMUCOSAL | Status: DC | PRN

## 2022-09-26 MED ORDER — ONDANSETRON HCL 4 MG PO TABS: 4.0000 mg | ORAL_TABLET | Freq: Four times a day (QID) | ORAL | Status: DC | PRN

## 2022-09-26 MED ORDER — FENTANYL CITRATE PF 50 MCG/ML IJ SOSY
50.0000 ug | PREFILLED_SYRINGE | Freq: Once | INTRAMUSCULAR | Status: AC
  Administered 2022-09-26: 50 ug via INTRAVENOUS
  Filled 2022-09-26: qty 1

## 2022-09-26 MED ORDER — ORAL CARE MOUTH RINSE: 15.0000 mL | Freq: Once | OROMUCOSAL | Status: AC

## 2022-09-26 MED ORDER — SODIUM CHLORIDE 0.9% FLUSH
3.0000 mL | Freq: Two times a day (BID) | INTRAVENOUS | Status: DC
  Administered 2022-09-26: 3 mL via INTRAVENOUS

## 2022-09-26 MED ORDER — THROMBIN (RECOMBINANT) 5000 UNITS EX SOLR
CUTANEOUS | Status: DC | PRN
  Administered 2022-09-26: 1 via TOPICAL

## 2022-09-26 MED ORDER — PHENYLEPHRINE HCL-NACL 20-0.9 MG/250ML-% IV SOLN
INTRAVENOUS | Status: DC | PRN
  Administered 2022-09-26: 30 ug/min via INTRAVENOUS

## 2022-09-26 MED ORDER — ROCURONIUM BROMIDE 10 MG/ML (PF) SYRINGE
PREFILLED_SYRINGE | INTRAVENOUS | Status: AC
  Filled 2022-09-26: qty 10

## 2022-09-26 SURGICAL SUPPLY — 48 items
ADH SKN CLS APL DERMABOND .7 (GAUZE/BANDAGES/DRESSINGS) ×1
BAG COUNTER SPONGE SURGICOUNT (BAG) ×1 IMPLANT
BAG SPNG CNTER NS LX DISP (BAG) ×1
BAND INSRT 18 STRL LF DISP RB (MISCELLANEOUS) ×2
BAND RUBBER #18 3X1/16 STRL (MISCELLANEOUS) ×2 IMPLANT
BLADE CLIPPER SURG (BLADE) IMPLANT
BUR DRUM 4.0 (BURR) ×1 IMPLANT
BUR MATCHSTICK NEURO 3.0 LAGG (BURR) ×1 IMPLANT
CANISTER SUCT 3000ML PPV (MISCELLANEOUS) ×1 IMPLANT
DERMABOND ADVANCED .7 DNX12 (GAUZE/BANDAGES/DRESSINGS) ×1 IMPLANT
DRAPE HALF SHEET 40X57 (DRAPES) IMPLANT
DRAPE LAPAROTOMY 100X72 PEDS (DRAPES) ×1 IMPLANT
DRAPE MICROSCOPE SLANT 54X150 (MISCELLANEOUS) ×1 IMPLANT
DURAPREP 6ML APPLICATOR 50/CS (WOUND CARE) ×1 IMPLANT
ELECT COATED BLADE 2.86 ST (ELECTRODE) ×1 IMPLANT
ELECT REM PT RETURN 9FT ADLT (ELECTROSURGICAL) ×1
ELECTRODE REM PT RTRN 9FT ADLT (ELECTROSURGICAL) ×1 IMPLANT
GAUZE 4X4 16PLY ~~LOC~~+RFID DBL (SPONGE) IMPLANT
GLOVE ECLIPSE 6.5 STRL STRAW (GLOVE) ×1 IMPLANT
GLOVE EXAM NITRILE XL STR (GLOVE) IMPLANT
GOWN STRL REUS W/ TWL LRG LVL3 (GOWN DISPOSABLE) ×2 IMPLANT
GOWN STRL REUS W/ TWL XL LVL3 (GOWN DISPOSABLE) IMPLANT
GOWN STRL REUS W/TWL 2XL LVL3 (GOWN DISPOSABLE) IMPLANT
GOWN STRL REUS W/TWL LRG LVL3 (GOWN DISPOSABLE) ×2
GOWN STRL REUS W/TWL XL LVL3 (GOWN DISPOSABLE)
KIT BASIN OR (CUSTOM PROCEDURE TRAY) ×1 IMPLANT
KIT TURNOVER KIT B (KITS) ×1 IMPLANT
NDL HYPO 25X1 1.5 SAFETY (NEEDLE) ×1 IMPLANT
NDL SPNL 22GX3.5 QUINCKE BK (NEEDLE) ×1 IMPLANT
NEEDLE HYPO 25X1 1.5 SAFETY (NEEDLE) ×1 IMPLANT
NEEDLE SPNL 22GX3.5 QUINCKE BK (NEEDLE) ×1 IMPLANT
NS IRRIG 1000ML POUR BTL (IV SOLUTION) ×1 IMPLANT
PACK LAMINECTOMY NEURO (CUSTOM PROCEDURE TRAY) ×1 IMPLANT
PAD ARMBOARD 7.5X6 YLW CONV (MISCELLANEOUS) ×3 IMPLANT
PIN DISTRACTION 14MM (PIN) IMPLANT
SOL ELECTROSURG ANTI STICK (MISCELLANEOUS) ×1
SOLUTION ELECTROSURG ANTI STCK (MISCELLANEOUS) ×1 IMPLANT
SPIKE FLUID TRANSFER (MISCELLANEOUS) ×1 IMPLANT
SPONGE INTESTINAL PEANUT (DISPOSABLE) ×1 IMPLANT
SPONGE SURGIFOAM ABS GEL SZ50 (HEMOSTASIS) ×1 IMPLANT
SUT VIC AB 0 CT1 27 (SUTURE)
SUT VIC AB 0 CT1 27XBRD ANTBC (SUTURE) IMPLANT
SUT VIC AB 3-0 SH 8-18 (SUTURE) ×1 IMPLANT
SWAB COLLECTION DEVICE MRSA (MISCELLANEOUS) IMPLANT
SWAB CULTURE ESWAB REG 1ML (MISCELLANEOUS) IMPLANT
TOWEL GREEN STERILE (TOWEL DISPOSABLE) ×1 IMPLANT
TOWEL GREEN STERILE FF (TOWEL DISPOSABLE) ×1 IMPLANT
WATER STERILE IRR 1000ML POUR (IV SOLUTION) ×1 IMPLANT

## 2022-09-26 NOTE — Progress Notes (Addendum)
Subjective:  Larry Lester was feeling good this morning, states that his upper extremity weakness has been improving a little. Denies significant pain and reports good appetite. Discussed reason for hospitalization and need for antibiotic treatment. Girlfriend present at bedside. All questions were answered.    Objective: Vitals over previous 24hr: Vitals:   09/26/22 1123 09/26/22 1154 09/26/22 1208 09/26/22 1425  BP:  112/74  130/81  Pulse:  79  89  Resp:  17  17  Temp: 97.9 F (36.6 C)  98.4 F (36.9 C) 98.1 F (36.7 C)  TempSrc: Oral  Oral   SpO2:  98%  100%    General:      awake and alert, lying comfortably in bed, cooperative, not in acute distress Head:      cervical collar in place Lungs:      normal respiratory effort, breathing unlabored, symmetrical chest rise, no crackles or wheezing Cardiac:      regular rate and rhythm, normal S1 and S2, no pitting edema Abdomen:      soft and non-distended, normoactive bowel sounds, no tenderness to palpation or guarding Musculoskeletal:  motor strength 3+/5 with R elbow extension, 4+/5 with R elbow flexion, 4+/5 left elbow flexion and extension, 5/5 in RLE and 4+/5 LLE Neurologic:      oriented to person-place-time, reduced sensation across C6 dermatome across LUE Psychiatric:      euthymic mood with congruent affect, intelligible speech    Assessment/Plan: Larry Lester is a 52 year old male with a past medical history of substance use disorder who presented with neck-back pain after experiencing a mechanical fall, now admitted for management of discitis-osteomyelitis and epidural phlegmon.    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 Upon arrival, cervical CT revealed evidence of C6-7 discitis and osteomyelitis with associated prevertebral soft  tissue swelling. Subsequent MRI demonstrated severe C6-C7 discitis-osteomyelitis, eroded endplates, epidural phlegmon, two punctate prevertebral abscesses, and mild-moderate spinal stenosis with cord compression. Lumbar MRI only notable for mild L5-S1 degeneration. Etiology remains unclear, patient reports occasionally smoking cocaine but denies any intravenous drug use. Urine drug screen is currently pending. Intravenous ceftriaxone and vancomycin regimen was started. Neurosurgery obtained culture of prevertebral abscesses, further intervention unnecessary. Celecoxib started to reduce inflammation, pain control with oxycodone. Antibiotic therapy will likely involve prolonged course administered intravenously. Plan to narrow antibiotic coverage based on culture susceptibilities.  > Infectious disease consult, appreciate recommendations  > Ceftriaxone 2g q24  > Vancomycin 750mg  q12  > Celecoxib 200mg  q12  > Oxycodone 5mg  q4 PRN +10mg  q4 PRN  > Acetaminophen 650mg  q4 PRN  > Follow culture, no growth d2   ---Cervical radiculopathy  Patient reports multiple recent falls at home and some baseline extremity weakness. Exam revealed decreased sensation in LUE and weakness in BUE most prominent on elbow extension. Findings are consistent with discitis and osteomyelitis at C6-C7 level. Plan for physical and occupational therapy evaluations, which will help inform discharge.  > Physical and occupational therapy   ---Substance use disorder Patient reports frequent cigar and occasional cocaine use, both through inhalation route. Denies using any intravenous drugs. Urine drug screen currently pending.  > Check urine drug screen  > Encourage cessation from cocaine   ---Normocytic anemia Upon arrival, laboratory testing demonstrated Hgb 11.4 slightly below baseline around 13-15. Iron panel collected 5-2 consistent with anemia of chronic disease, most likely secondary to  osteomyelitis.   > Monitor  clinically    Principal Problem:   Discitis of cervical region Active Problems:   Substance use disorder    Prior to Admission Living Arrangement: home with girlfriend Anticipated Discharge Location: undecided Barriers to Discharge: antibiotic regimen, intravenous route Dispo: Anticipated discharge in approximately 13-14 day(s).    Lajuana Ripple, MD Internal Medicine PGY-1 Pager [phone]  After 5pm on weekdays and 1pm on weekends: On Call pager [phone]

## 2022-09-26 NOTE — ED Notes (Signed)
Pt requesting to eat and drink; and also asking for pain medication

## 2022-09-26 NOTE — Anesthesia Preprocedure Evaluation (Addendum)
Anesthesia Evaluation  Patient identified by MRN, date of birth, ID band Patient awake    Reviewed: Allergy & Precautions, NPO status , Patient's Chart, lab work & pertinent test results  Airway Mallampati: II  TM Distance: >3 FB Neck ROM: Full    Dental  (+) Dental Advisory Given, Teeth Intact   Pulmonary former smoker   Pulmonary exam normal breath sounds clear to auscultation       Cardiovascular negative cardio ROS Normal cardiovascular exam Rhythm:Regular Rate:Normal     Neuro/Psych negative neurological ROS     GI/Hepatic negative GI ROS, Neg liver ROS,,,  Endo/Other  negative endocrine ROS    Renal/GU negative Renal ROS     Musculoskeletal negative musculoskeletal ROS (+)    Abdominal   Peds  Hematology negative hematology ROS (+)   Anesthesia Other Findings   Reproductive/Obstetrics                             Anesthesia Physical Anesthesia Plan  ASA: 3 and emergent  Anesthesia Plan: General   Post-op Pain Management: Ofirmev IV (intra-op)*   Induction: Intravenous  PONV Risk Score and Plan: 3 and Ondansetron, Dexamethasone, Treatment may vary due to age or medical condition and Midazolam  Airway Management Planned: Oral ETT and Video Laryngoscope Planned  Additional Equipment:   Intra-op Plan:   Post-operative Plan: Possible Post-op intubation/ventilation  Informed Consent: I have reviewed the patients History and Physical, chart, labs and discussed the procedure including the risks, benefits and alternatives for the proposed anesthesia with the patient or authorized representative who has indicated his/her understanding and acceptance.     Dental advisory given  Plan Discussed with: CRNA  Anesthesia Plan Comments: (2  x PIV. +/- aline. Discussed risks of anesthesia with cocaine usage.)       Anesthesia Quick Evaluation

## 2022-09-26 NOTE — Anesthesia Procedure Notes (Signed)
Procedure Name: Intubation Date/Time: 09/26/2022 12:39 PM  Performed by: Stanton Kidney, CRNAPre-anesthesia Checklist: Patient identified, Patient being monitored, Timeout performed, Emergency Drugs available and Suction available Patient Re-evaluated:Patient Re-evaluated prior to induction Oxygen Delivery Method: Circle system utilized Preoxygenation: Pre-oxygenation with 100% oxygen Induction Type: IV induction Ventilation: Mask ventilation without difficulty Laryngoscope Size: McGraph and 4 Grade View: Grade I Tube type: Oral Tube size: 7.5 mm Number of attempts: 1 Airway Equipment and Method: Stylet Placement Confirmation: ETT inserted through vocal cords under direct vision, positive ETCO2 and breath sounds checked- equal and bilateral Secured at: 22 cm Tube secured with: Tape Dental Injury: Teeth and Oropharynx as per pre-operative assessment  Comments: C collar in place, head and neck remained in neutral position, easy mask airway

## 2022-09-26 NOTE — Consult Note (Signed)
Reason for Consult:cervical osteomyelitis Referring Physician: Rancour, Larry Lester is an 52 y.o. male.  HPI: who has been a frequent visitor to the [facility] ED in April of this year. His visit today was due to a fall while in his bathroom cleaning the tub. Denies hitting his head. No loc. Complaining of parasthesias in the upper and lower extremities. Admits to cocaine use, and used last night. Denies all other illicit drug use.  MRI showed discitis at C6/7, and canal stenosis. CT shows arthrodesis at C6/7, and their is no remaining disc space. I was called for imaging findings.  Larry Lester does complain of neck pain.   History reviewed. No pertinent past medical history.  History reviewed. No pertinent surgical history.  History reviewed. No pertinent family history.  Social History:  reports that he has quit smoking. His smoking use included cigarettes. He does not have any smokeless tobacco history on file. He reports that he does not currently use drugs after having used the following drugs: Marijuana. He reports that he does not drink alcohol.  Allergies:  Allergies  Allergen Reactions   Crab [Shellfish Allergy] Anaphylaxis    Medications: I have reviewed the patient's current medications.  Results for orders placed or performed during the hospital encounter of 09/26/22 (from the past 48 hour(s))  Comprehensive metabolic panel     Status: Abnormal   Collection Time: 09/26/22  5:56 AM  Result Value Ref Range   Sodium 132 (L) 135 - 145 mmol/L   Potassium 3.6 3.5 - 5.1 mmol/L   Chloride 102 98 - 111 mmol/L   CO2 19 (L) 22 - 32 mmol/L   Glucose, Bld 84 70 - 99 mg/dL    Comment: Glucose reference range applies only to samples taken after fasting for at least 8 hours.   BUN 12 6 - 20 mg/dL   Creatinine, Ser 1.61 0.61 - 1.24 mg/dL   Calcium 8.9 8.9 - 09.6 mg/dL   Total Protein 8.7 (H) 6.5 - 8.1 g/dL   Albumin 3.0 (L) 3.5 - 5.0 g/dL   AST 24 15 - 41 U/L   ALT 17 0 -  44 U/L   Alkaline Phosphatase 54 38 - 126 U/L   Total Bilirubin 0.7 0.3 - 1.2 mg/dL   GFR, Estimated >04 >54 mL/min    Comment: (NOTE) Calculated using the CKD-EPI Creatinine Equation (2021)    Anion gap 11 5 - 15    Comment: Performed at [facility], [address], [place], [place] [address]  CBC with Differential     Status: Abnormal   Collection Time: 09/26/22  5:56 AM  Result Value Ref Range   WBC 7.9 4.0 - 10.5 K/uL   RBC 3.37 (L) 4.22 - 5.81 MIL/uL   Hemoglobin 11.4 (L) 13.0 - 17.0 g/dL   HCT 91.4 (L) 78.2 - 95.6 %   MCV 99.7 80.0 - 100.0 fL   MCH 33.8 26.0 - 34.0 pg   MCHC 33.9 30.0 - 36.0 g/dL   RDW 21.3 08.6 - 57.8 %   Platelets 435 (H) 150 - 400 K/uL   nRBC 0.0 0.0 - 0.2 %   Neutrophils Relative % 68 %   Neutro Abs 5.3 1.7 - 7.7 K/uL   Lymphocytes Relative 22 %   Lymphs Abs 1.7 0.7 - 4.0 K/uL   Monocytes Relative 10 %   Monocytes Absolute 0.8 0.1 - 1.0 K/uL   Eosinophils Relative 0 %   Eosinophils Absolute 0.0  0.0 - 0.5 K/uL   Basophils Relative 0 %   Basophils Absolute 0.0 0.0 - 0.1 K/uL   Immature Granulocytes 0 %   Abs Immature Granulocytes 0.02 0.00 - 0.07 K/uL    Comment: Performed at [facility], [address] [place], [place] [address]  Protime-INR     Status: None   Collection Time: 09/26/22  5:56 AM  Result Value Ref Range   Prothrombin Time 13.9 11.4 - 15.2 seconds   INR 1.1 0.8 - 1.2    Comment: (NOTE) INR goal varies based on device and disease states. Performed at [facility], [address], [place], [place] [address]   APTT     Status: None   Collection Time: 09/26/22  5:56 AM  Result Value Ref Range   aPTT 32 24 - 36 seconds    Comment: Performed at [facility], [address], [place], [place] [address]  Lactic acid, plasma     Status: None   Collection Time: 09/26/22  6:13 AM  Result Value Ref Range   Lactic Acid, Venous 0.8 0.5 - 1.9 mmol/L    Comment: Performed at [facility], [address] [place], [place] [address]    MR Cervical Spine W and Wo Contrast  Addendum Date: 09/26/2022   ADDENDUM REPORT: 09/26/2022 08:42 ADDENDUM: Study discussed by telephone with Dr. Manus Gunning on 09/26/2022 at 0838 hours. Electronically Signed   By: Odessa Fleming M.D.   On: 09/26/2022 08:42   Result Date: 09/26/2022 CLINICAL DATA:  52 year old male status post fall. Pain. CT evidence of C6-C7 discitis osteomyelitis. EXAM: MRI CERVICAL SPINE WITHOUT AND WITH CONTRAST TECHNIQUE: Multiplanar and multiecho pulse sequences of the cervical spine, to include the craniocervical junction and cervicothoracic junction, were obtained without and with intravenous contrast. CONTRAST:  8mL GADAVIST GADOBUTROL 1 MMOL/ML IV SOLN COMPARISON:  Cervical spine CT 0526 hours today. FINDINGS: Alignment: Stable, straightening of cervical lordosis except for mild kyphotic deformity at the abnormal C6-C7 level. Vertebrae: Confluent marrow edema, enhancement superimposed on the partially eroded C6 and C7 endplates, vertebral bodies. See series 3, image 8. Adjacent C5 and T1 levels appear spared. Background bone marrow signal within normal limits. Cord: Mild cervical spinal cord edema is possible from C5-T1, see STIR series 4, image 7. Bulky ventral dural thickening and enhancement from C6 to T1 (series 10, image 22). No abnormal intramedullary enhancement. Posterior Fossa, vertebral arteries, paraspinal tissues: Cervicomedullary junction is within normal limits. Negative visible posterior fossa. Generalized prevertebral fluid and/or edema beginning from the inferior C2 level continuing to T1. Superimposed bulky, stellate and enhancing paraspinal phlegmon centered at the C6-C7 disc space (series 10, image 26). Confluent bilateral longus coli muscle involvement. Abnormal inflammation, enhancement in the interspinous ligament there (might be related to altered biomechanics series 4, image 7). Punctate rim enhancing fluid collections in the prevertebral  space at C6-C7 best demonstrated on series 7, image 25. No other discrete paraspinal fluid collection. Phlegmon involves the course of both cervical vertebral arteries (series 10, image 23) with maintained flow voids indicating patency. Visible carotid flow voids are maintained. Disc levels: Ordinary cervical spine degeneration C4-C5 and C5-C6. Severe C6-C7 discitis osteomyelitis. Confluent ventral phlegmon with enhancement, dural thickening (series 10, images 22 through 26) along with mild retrolisthesis and kyphosis resulting in mild to moderate spinal stenosis and cord mass effect. Severe bilateral C7 neural foraminal involvement. Inflammation also in the bilateral C8 neural foramen greater on the left. C7-T1  disc space remains normal. No visible upper thoracic spinal stenosis. IMPRESSION: 1. Severe Discitis Osteomyelitis at C6-C7. Eroded endplates, mild retrolisthesis, bulky paraspinal and ventral Epidural Phlegmon, dural thickening and enhancement. Prevertebral edema with 2 punctate prevertebral space abscesses (not drainable, see series 7 image 25). Confluent posterior interspinous ligament edema, might be related to the abnormal biomechanics. Subsequent mild to moderate Spinal Stenosis And Cord Compression with probable Mild Spinal Cord Edema. 2. Ordinary cervical spine degeneration otherwise. Electronically Signed: By: Odessa Fleming M.D. On: 09/26/2022 08:34   DG Pelvis 1-2 Views  Result Date: 09/26/2022 CLINICAL DATA:  52 year old male status post fall.  Pain. EXAM: PELVIS - 1-2 VIEW COMPARISON:  CT Chest, Abdomen, and Pelvis 04/19/2021. FINDINGS: AP pelvis at 0708 hours. Femoral heads remain normally located. Hip joint spaces appear symmetric and normal for age. Grossly intact proximal femurs. No pelvis fracture identified. Symphysis and SI joints within normal limits. Incidental pelvic phleboliths. Negative visible bowel gas. IMPRESSION: No acute fracture or dislocation identified about the pelvis.  Electronically Signed   By: Odessa Fleming M.D.   On: 09/26/2022 07:15   CT Cervical Spine Wo Contrast  Result Date: 09/26/2022 CLINICAL DATA:  52 year old male status post fall. Pain. EXAM: CT CERVICAL SPINE WITHOUT CONTRAST TECHNIQUE: Multidetector CT imaging of the cervical spine was performed without intravenous contrast. Multiplanar CT image reconstructions were also generated. RADIATION DOSE REDUCTION: This exam was performed according to the departmental dose-optimization program which includes automated exposure control, adjustment of the mA and/or kV according to patient size and/or use of iterative reconstruction technique. COMPARISON:  Head CT today. CT Chest, Abdomen, and Pelvis 04/19/2021. FINDINGS: Alignment: Overall straightening of cervical lordosis, abnormal mild kyphosis about the C6-C7 level, with associated abnormal retrolisthesis there. See additional details below. Skull base and vertebrae: Background bone mineralization is normal. Visualized skull base is intact. No atlanto-occipital dissociation. C1 and C2 appear intact and aligned. C3 through C5 vertebrae also appear intact and aligned. Partially eroded C6 and C7 vertebral bodies, including virtually all of vertebral endplates there and obliterated C6-C7 disc space. The C6-C7 disc space and endplates were partially visible on the 2022 comparison and negative at that time. Mild retrolisthesis of C6 on C7. Posterior elements at this level remain aligned. Soft tissues and spinal canal: Abnormal prevertebral soft tissue thickening at C6-C7. Edema in the prevertebral or retropharyngeal space above that level (series 4, image 58. Negative other visible noncontrast neck soft tissues. Disc levels: Obliterated C6-C7 disc space and retrolisthesis combine for mild multifactorial spinal stenosis at C6-C7. Upper chest: Visible upper thoracic levels remain intact, clear lung apices. IMPRESSION: 1. Positive for C6-C7 Discitis Osteomyelitis, new since 2022 and  likely Acute/subacute. - obliterated disc space with Moderately eroded C6 and C7 endplates, vertebral bodies. - mild retrolisthesis AND multifactorial spinal stenosis there. - associated prevertebral soft tissue swelling and/or edema maximal at C6-C7. Recommend Blood Cultures. And follow-up Cervical Spine MRI without and with contrast may be valuable to fully characterize. 2. Other cervical vertebrae appear intact and aligned. Electronically Signed   By: Odessa Fleming M.D.   On: 09/26/2022 05:49   CT Head Wo Contrast  Result Date: 09/26/2022 CLINICAL DATA:  52 year old male status post fall.  Pain. EXAM: CT HEAD WITHOUT CONTRAST TECHNIQUE: Contiguous axial images were obtained from the base of the skull through the vertex without intravenous contrast. RADIATION DOSE REDUCTION: This exam was performed according to the departmental dose-optimization program which includes automated exposure control, adjustment of the mA and/or kV according  to patient size and/or use of iterative reconstruction technique. COMPARISON:  None Available. FINDINGS: Brain: Normal cerebral volume. No midline shift, ventriculomegaly, mass effect, evidence of mass lesion, intracranial hemorrhage or evidence of cortically based acute infarction. Gray-white matter differentiation is within normal limits throughout the brain. Vascular: No suspicious intracranial vascular hyperdensity. Skull: No acute osseous abnormality identified. Partially visible dental caries. Sinuses/Orbits: Visualized paranasal sinuses and mastoids are well aerated. Other: No orbit or scalp soft tissue injury identified. IMPRESSION: 1. No acute traumatic injury identified. Normal noncontrast CT appearance of the brain. 2. Partially visible dental caries. Electronically Signed   By: Odessa Fleming M.D.   On: 09/26/2022 05:42   DG Chest 2 View  Result Date: 09/26/2022 CLINICAL DATA:  Fall with shoulder pain. EXAM: CHEST - 2 VIEW COMPARISON:  AP Lat chest 09/04/2022 FINDINGS: Heart  size and vasculature are normal. The mediastinum is normally outlined. The lungs are clear. There are no acute osseous findings. Mild osteopenia. IMPRESSION: No evidence of acute chest disease or interval changes. Electronically Signed   By: Almira Bar M.D.   On: 09/26/2022 05:24    Review of Systems  Constitutional:  Positive for fatigue.  Eyes: Negative.   Respiratory: Negative.    Cardiovascular:  Positive for chest pain.  Gastrointestinal: Negative.   Endocrine: Negative.   Genitourinary: Negative.   Musculoskeletal:  Positive for arthralgias and back pain.  Skin: Negative.   Allergic/Immunologic: Negative.   Neurological:  Positive for weakness.  Hematological: Negative.   Psychiatric/Behavioral: Negative.     Blood pressure 137/83, pulse 79, temperature 97.8 F (36.6 C), temperature source Oral, resp. rate 10, SpO2 99 %. Physical Exam Constitutional:      General: He is not in acute distress.    Appearance: Normal appearance. He is normal weight. He is ill-appearing.  HENT:     Head: Normocephalic and atraumatic.     Right Ear: External ear normal.     Left Ear: External ear normal.     Nose: Nose normal.     Mouth/Throat:     Mouth: Mucous membranes are moist.     Pharynx: Oropharynx is clear.  Eyes:     Extraocular Movements: Extraocular movements intact.     Conjunctiva/sclera: Conjunctivae normal.     Pupils: Pupils are equal, round, and reactive to light.  Cardiovascular:     Rate and Rhythm: Normal rate and regular rhythm.  Pulmonary:     Effort: Pulmonary effort is normal.     Breath sounds: Normal breath sounds.  Abdominal:     General: Abdomen is flat.     Palpations: Abdomen is soft.  Musculoskeletal:     Comments: Restricted shoulder abduction bilateral Neck pain with upright position, and movement  Skin:    General: Skin is warm and dry.  Neurological:     Mental Status: He is alert and oriented to person, place, and time.     Cranial Nerves: No  cranial nerve deficit.     Sensory: Sensation is intact. No sensory deficit.     Motor: Weakness present.     Coordination: Coordination is intact. Coordination normal.     Comments: Tricep weakness bilaterally Gait not assessed  Psychiatric:        Mood and Affect: Mood normal.        Behavior: Behavior normal.        Thought Content: Thought content normal.     Assessment/Plan: OR for open biopsy prevertebral soft tissue. Weakness of triceps  correlates with level of discitis. Will be admitted by medicine post op. Risks and benefits were explained by me to Larry Lester and his significant other.  I answered their questions and he has agreed to the procedure. I have spoken with the infectious disease service, Dr. Luciana Axe. He agrees to benefit of tissue sampling.  He is npo.   Coletta Memos 09/26/2022, 10:41 AM

## 2022-09-26 NOTE — ED Provider Notes (Signed)
[place] EMERGENCY DEPARTMENT AT [facility] Provider Note   CSN: [id number] Arrival date & time: 09/26/22  0319     History  Chief Complaint  Patient presents with   Larry Lester is a 52 y.o. male who presents to ED for evaluation after fall. Patient was cleaning tub when he slipped and fell. States that he fell on his tailbone and his left arm hit the tub - endorses pain in these two areas. Also endorses paresthesias in bilateral upper and lower extremities. Denies hitting head.  Denies syncope, loss of consciousness, vomiting, amnesia, seizures, hx of blood thinners, AMS.   Fall       Home Medications Prior to Admission medications   Medication Sig Start Date End Date Taking? Authorizing Provider  hydrOXYzine (ATARAX) 25 MG tablet Take 1 tablet (25 mg total) by mouth every 6 (six) hours as needed for itching. 02/05/22   Jeannie Fend, PA-C  ibuprofen (ADVIL) 600 MG tablet Take 1 tablet (600 mg total) by mouth every 6 (six) hours as needed. 09/05/22   Fayrene Helper, PA-C  methocarbamol (ROBAXIN) 500 MG tablet Take 1 tablet (500 mg total) by mouth 2 (two) times daily. 09/05/22   Fayrene Helper, PA-C  potassium chloride SA (KLOR-CON M) 20 MEQ tablet Take 1 tablet (20 mEq total) by mouth 2 (two) times daily. 09/09/22   Clark, Meghan R, PA-C  triamcinolone cream (KENALOG) 0.1 % Apply 1 Application topically 2 (two) times daily. 02/05/22   Jeannie Fend, PA-C      Allergies    Parke Simmers allergy]    Review of Systems   Review of Systems  Neurological:  Negative for syncope.    Physical Exam Updated Vital Signs BP (!) 144/87   Pulse 78   Temp 97.7 F (36.5 C)   Resp 16   SpO2 100%  Physical Exam Vitals and nursing note reviewed.  Constitutional:      General: He is not in acute distress. HENT:     Head: Normocephalic and atraumatic.     Right Ear: Tympanic membrane, ear canal and external ear normal.     Left Ear: Tympanic membrane, ear  canal and external ear normal.     Mouth/Throat:     Mouth: Mucous membranes are moist.     Pharynx: No oropharyngeal exudate or posterior oropharyngeal erythema.  Eyes:     General: No scleral icterus.       Right eye: No discharge.        Left eye: No discharge.     Extraocular Movements: Extraocular movements intact.     Conjunctiva/sclera: Conjunctivae normal.     Pupils: Pupils are equal, round, and reactive to light.  Cardiovascular:     Rate and Rhythm: Normal rate.     Pulses: Normal pulses.     Heart sounds: Normal heart sounds. No murmur heard. Pulmonary:     Effort: Pulmonary effort is normal. No respiratory distress.     Breath sounds: Normal breath sounds. No wheezing, rhonchi or rales.  Abdominal:     Tenderness: There is no abdominal tenderness.  Musculoskeletal:        General: Normal range of motion.     Right lower leg: No edema.     Left lower leg: No edema.     Comments: Full active ROM of shoulders, hips, knees, and ankles bilaterally. Tenderness to palpation of left shoulder and sacrum.  Skin:    General:  Skin is warm and dry.     Findings: No rash.  Neurological:     General: No focal deficit present.     Mental Status: He is alert. Mental status is at baseline.  Psychiatric:        Mood and Affect: Mood normal.     ED Results / Procedures / Treatments   Labs (all labs ordered are listed, but only abnormal results are displayed) Labs Reviewed  CULTURE, BLOOD (ROUTINE X 2)  CULTURE, BLOOD (ROUTINE X 2)  LACTIC ACID, PLASMA  LACTIC ACID, PLASMA  COMPREHENSIVE METABOLIC PANEL  CBC WITH DIFFERENTIAL/PLATELET  PROTIME-INR  APTT  TROPONIN I (HIGH SENSITIVITY)    EKG None  Radiology CT Cervical Spine Wo Contrast  Result Date: 09/26/2022 CLINICAL DATA:  52 year old male status post fall. Pain. EXAM: CT CERVICAL SPINE WITHOUT CONTRAST TECHNIQUE: Multidetector CT imaging of the cervical spine was performed without intravenous contrast. Multiplanar  CT image reconstructions were also generated. RADIATION DOSE REDUCTION: This exam was performed according to the departmental dose-optimization program which includes automated exposure control, adjustment of the mA and/or kV according to patient size and/or use of iterative reconstruction technique. COMPARISON:  Head CT today. CT Chest, Abdomen, and Pelvis 04/19/2021. FINDINGS: Alignment: Overall straightening of cervical lordosis, abnormal mild kyphosis about the C6-C7 level, with associated abnormal retrolisthesis there. See additional details below. Skull base and vertebrae: Background bone mineralization is normal. Visualized skull base is intact. No atlanto-occipital dissociation. C1 and C2 appear intact and aligned. C3 through C5 vertebrae also appear intact and aligned. Partially eroded C6 and C7 vertebral bodies, including virtually all of vertebral endplates there and obliterated C6-C7 disc space. The C6-C7 disc space and endplates were partially visible on the 2022 comparison and negative at that time. Mild retrolisthesis of C6 on C7. Posterior elements at this level remain aligned. Soft tissues and spinal canal: Abnormal prevertebral soft tissue thickening at C6-C7. Edema in the prevertebral or retropharyngeal space above that level (series 4, image 58. Negative other visible noncontrast neck soft tissues. Disc levels: Obliterated C6-C7 disc space and retrolisthesis combine for mild multifactorial spinal stenosis at C6-C7. Upper chest: Visible upper thoracic levels remain intact, clear lung apices. IMPRESSION: 1. Positive for C6-C7 Discitis Osteomyelitis, new since 2022 and likely Acute/subacute. - obliterated disc space with Moderately eroded C6 and C7 endplates, vertebral bodies. - mild retrolisthesis AND multifactorial spinal stenosis there. - associated prevertebral soft tissue swelling and/or edema maximal at C6-C7. Recommend Blood Cultures. And follow-up Cervical Spine MRI without and with contrast  may be valuable to fully characterize. 2. Other cervical vertebrae appear intact and aligned. Electronically Signed   By: Odessa Fleming M.D.   On: 09/26/2022 05:49   CT Head Wo Contrast  Result Date: 09/26/2022 CLINICAL DATA:  52 year old male status post fall.  Pain. EXAM: CT HEAD WITHOUT CONTRAST TECHNIQUE: Contiguous axial images were obtained from the base of the skull through the vertex without intravenous contrast. RADIATION DOSE REDUCTION: This exam was performed according to the departmental dose-optimization program which includes automated exposure control, adjustment of the mA and/or kV according to patient size and/or use of iterative reconstruction technique. COMPARISON:  None Available. FINDINGS: Brain: Normal cerebral volume. No midline shift, ventriculomegaly, mass effect, evidence of mass lesion, intracranial hemorrhage or evidence of cortically based acute infarction. Gray-white matter differentiation is within normal limits throughout the brain. Vascular: No suspicious intracranial vascular hyperdensity. Skull: No acute osseous abnormality identified. Partially visible dental caries. Sinuses/Orbits: Visualized paranasal sinuses and mastoids  are well aerated. Other: No orbit or scalp soft tissue injury identified. IMPRESSION: 1. No acute traumatic injury identified. Normal noncontrast CT appearance of the brain. 2. Partially visible dental caries. Electronically Signed   By: Odessa Fleming M.D.   On: 09/26/2022 05:42   DG Chest 2 View  Result Date: 09/26/2022 CLINICAL DATA:  Fall with shoulder pain. EXAM: CHEST - 2 VIEW COMPARISON:  AP Lat chest 09/04/2022 FINDINGS: Heart size and vasculature are normal. The mediastinum is normally outlined. The lungs are clear. There are no acute osseous findings. Mild osteopenia. IMPRESSION: No evidence of acute chest disease or interval changes. Electronically Signed   By: Almira Bar M.D.   On: 09/26/2022 05:24    Procedures Procedures    Medications Ordered  in ED Medications  fentaNYL (SUBLIMAZE) injection 50 mcg (has no administration in time range)    ED Course/ Medical Decision Making/ A&P                             Medical Decision Making  This patient presents to the ED after a fall, this involves an extensive number of treatment options, and is a complaint that carries with it a high risk of complications and morbidity.  The differential diagnosis includes  intracranial hemorrhage, subdural/epidural hematoma, vertebral fracture, spinal cord injury, muscle strain, skull fracture, fracture.   Co morbidities that complicate the patient evaluation  None   Lab Tests:  I Ordered, and personally interpreted labs.  The pertinent results include:   -Blood culture: pending -APTT/PTINR: pending -CBC: pending -CMP: pending -LA: pending   Imaging Studies ordered:  I ordered imaging studies including  -CT head: No acute traumatic injury identified. Normal noncontrast CT appearance of the brain. Partially visible dental caries. -CT cervical spine:  Positive for C6-C7 Discitis Osteomyelitis, new since 2022 and likely Acute/subacute. obliterated disc space with Moderately eroded C6 and C7 endplates, vertebral bodies. mild retrolisthesis AND multifactorial spinal stenosis there. associated prevertebral soft tissue swelling and/or edema maximal at C6-C7. Other cervical vertebrae appear intact and aligned. -Chest xray: No evidence of acute chest disease or interval changes.  -Pelvic xray: pending I independently visualized and interpreted imaging I agree with the radiologist interpretation   Problem List / ED Course / Critical interventions / Medication management  Patient presented for Fall. Patient with stable vitals and does not appear to be in distress. Obtaining CT cervical spine and head given patient's recent cocaine use and bilateral upper/lower extremity paresthesias.  CT concerned with acute vs subacute osteomyelitis of C6-C7. I  shared with patient the results of imaging. Obtaining MRI and labs to further assess infection. Consulting neurology after imaging and labs results to further evaluate the need for inpatient admission. I have reviewed the patients home medicines and have made adjustments as needed   DDx: These are considered less likely due to history of present illness and physical exam findings -Intracranial hemorrhage, subdural/epidural hematoma: Canadian head CT score of 0 -Vertebral fracture/spinal cord injury: Cervical CT without concern -Skull fracture: No postauricular ecchymosis, no periorbital ecchymosis, no hemotympanum -Fracture: No step-offs/crepitus/abnormalities palpated in head, neck, chest, upper extremities, lower extremities, pelvis  6:55 AM Care of [name] transferred to PA Jodi Geralds at the end of my shift as the patient will require reassessment once labs/imaging have resulted. Patient presentation, ED course, and plan of care discussed with review of all pertinent labs and imaging. Please see his/her note for further  details regarding further ED course and disposition. Plan at time of handoff is await MRI and lab results and consult neurology for possible inpatient admission. This may be altered or completely changed at the discretion of the oncoming team pending results of further workup.   Risk Stratification Score:  Nexus C-spine: imaging recommended Canadian Head CT: 0   Social Determinants of Health:  none            Final Clinical Impression(s) / ED Diagnoses Final diagnoses:  None    Rx / DC Orders ED Discharge Orders     None         Dorthy Cooler, [place] 09/26/22 0700    Tilden Fossa, MD 09/28/22 [phone]

## 2022-09-26 NOTE — TOC Initial Note (Signed)
Transition of Care [facility]) - Initial/Assessment Note    Patient Details  Name: Larry Lester MRN: [id number] Date of Birth: [birth year]  [facility] CM/SW Contact:    Lockie Pares, RN Phone Number: 09/26/2022, 2:55 PM  Clinical Narrative:                  52 yo presented with neck and back pain. Cervical discitis by MRI. Patient has had balance issues, falling several times. PT OT to evaluate. Patient will go for bone bx and IV antibiotics started. Admits to cocaine use occasionally, no IV drug use.  TOC will follow for recommendations, needs, and transitions.   Barriers to Discharge: Continued Medical Work up   Patient Goals and CMS Choice            Expected Discharge Plan and Services   Discharge Planning Services: CM Consult   Living arrangements for the past 2 months: Single Family Home                                      Prior Living Arrangements/Services Living arrangements for the past 2 months: Single Family Home   Patient language and need for interpreter reviewed:: Yes        Need for Family Participation in Patient Care: Yes (Comment)     Criminal Activity/Legal Involvement Pertinent to Current Situation/Hospitalization: No - Comment as needed  Activities of Daily Living      Permission Sought/Granted                  Emotional Assessment       Orientation: : Oriented to Self, Oriented to Place, Oriented to  Time Alcohol / Substance Use: Illicit Drugs Psych Involvement: No (comment)  Admission diagnosis:  Osteomyelitis of cervical spine (HCC) [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 [place]) [place] [address] Phone: [phone] Fax: [phone]     Social Determinants of Health (SDOH) Social History: SDOH Screenings   Tobacco Use: Medium Risk (09/26/2022)   SDOH Interventions:     Readmission Risk Interventions     No data to display

## 2022-09-26 NOTE — ED Provider Notes (Signed)
Care assumed from [facility] and Dr. Madilyn Hook at shift change, please see their notes for full details, but in brief Larry Lester is a 52 y.o. male presented for a fall while cleaning his bathroom, incidentally found to have cervical discitis and osteomyelitis on CT.  Reports cocaine use but denies IV drug use.  Has had some nonspecific back pain, muscle spasms and upper extremity weakness that he has been seen in the ED for over the past few weeks.  MRI pending at signout.  Anticipate neurosurgery consult and admission.  Physical Exam  BP 131/70   Pulse 83   Temp 97.8 F (36.6 C) (Oral)   Resp 16   SpO2 97%   On neurologic exam patient with bilateral lower extremity reflexes intact, he has mild weakness in bilateral upper extremities with 4/5 strength, 5/5 strength in bilateral lower extremities.  He reports paresthesias from the waist down but sensation to soft touch intact.  Procedures  Procedures  ED Course / MDM    Medical Decision Making Amount and/or Complexity of Data Reviewed Labs: ordered. Radiology: ordered.  Risk Prescription drug management. Decision regarding hospitalization.   Severe Discitis Osteomyelitis at C6-C7. Eroded endplates, mild retrolisthesis, bulky paraspinal and ventral Epidural Phlegmon, dural thickening and enhancement. Prevertebral edema with 2 punctate prevertebral space abscesses. Confluent posterior interspinous ligament edema, might be related to the abnormal biomechanics.Subsequent mild to moderate Spinal Stenosis And Cord Compression with probable Mild Spinal Cord Edema.  Case discussed with Dr. Franky Macho with neurosurgery who is seen and evaluated patient as well.  He has reviewed imaging, there is no structural indication for surgery but he spoke with Dr. Luciana Axe with infectious disease who states that it would be very helpful to have a fluid sample to culture if available.  Dr. Franky Macho will plan to take patient to the OR today to access  prevertebral space and obtain fluid for culture for directed antibiotic therapy.  Will hold off on initiating antibiotics for her osteomyelitis and discitis until sample is obtained.  Dr. Franky Macho request medicine admission.  Case discussed with Dr. Cyndie Chime with internal medicine teaching service who will see and admit the patient.         Dartha Lodge, PA-C 09/26/22 1213    Glynn Octave, MD 09/26/22 1818

## 2022-09-26 NOTE — Consult Note (Signed)
[facility]    Date of Admission:  09/26/2022     Total days of antibiotics 0       Reason for Consult: Discitis/Osteomyelitis  Referring Provider: Dr. Franky Macho Primary Care Provider: Pcp, No   ASSESSMENT:  Mr. Penn is a 52 y/o male admitted with neck pain following a fall with imaging showing acute/subacute severe discitis/osteomyelitis of C6-C7. Neurosurgery collected specimens which are pending. Will need prolonged course of antibiotics going forward. Start broad spectrum coverage with vancomycin and ceftriaxone. Post-operative wound care per Neurosurgery with remaining medical and supportive care per Internal Medicine.    PLAN:  Start vancomycin and ceftriaxone. Therapeutic monitoring of renal function and vancomycin levels. Monitor cultures for organism identification and narrow antibiotics as appropriate. Post-surgical wound care per Neurosurgery. Remaining medical and supportive care per Internal Medicine.    Principal Problem:   Discitis of cervical region Active Problems:   Substance use disorder   Cervical discitis    celecoxib  200 mg Oral Q12H   heparin injection (subcutaneous)  5,000 Units Subcutaneous Q8H   HYDROmorphone       oxyCODONE  10 mg Oral Q12H   senna-docusate  1 tablet Oral QHS   sodium chloride flush  3 mL Intravenous Q12H    HPI: LEWIN PELLOW is a 52 y.o. male with no significant previous medical history presents to the ED with neck and back pain following multiple falls.  Mr. Schleifer was seen in the ED on 09/09/22 with complaint of generalized back pain and muscle spasms with severity that kept him out of work for at least a week. Had numbness and to his extremities at the time. Had normal neurological exam and was diagnosed with muscle spasms and treated with Robaxin.   Mr. Glasscock returned to the ED following a fall sustained while cleaning his tub landing on his tailbone and left arm striking the tub  and had paraesthesias in his bilateral upper and lower extremities. Used cocaine with denial of IV drug use. CT head was unremarkable with CT cervical spine with acute/subacute C6-C7 discitis/osteomyelitis. Pelvic x-ray with no significant findings. MRI cervical spine with severe discitis/osteomyelitis at C6-C7 and bulky paraspinal and ventral epidural phlegmon; and prevertebral edema with 2 punctate prevertebral abscesses; and mild to moderate spinal stenosis and cord compression with probable mild spinal cord edema. Neurosurgery consulted with no structural indication for surgical intervention but will take to OR to access prevertebral space and obtaining fluid for culture to direct antibiotic treatment.  Mr. Hinde was seen in the PACU following surgery confirming back pain and 3 falls. No recent antibiotics.   Review of Systems: Review of Systems  Constitutional:  Negative for chills, fever and weight loss.  Respiratory:  Negative for cough, shortness of breath and wheezing.   Cardiovascular:  Negative for chest pain and leg swelling.  Gastrointestinal:  Negative for abdominal pain, constipation, diarrhea, nausea and vomiting.  Musculoskeletal:  Positive for neck pain.  Skin:  Negative for rash.     History reviewed. No pertinent past medical history.  Social History   Tobacco Use   Smoking status: Former    Types: Cigarettes  Substance Use Topics   Alcohol use: No   Drug use: Not Currently    Types: Marijuana    Comment: crack    History reviewed. No pertinent family history.  Allergies  Allergen Reactions   Crab [Shellfish Allergy] Anaphylaxis    OBJECTIVE: Blood pressure 127/82, pulse 79,  temperature 98.1 F (36.7 C), resp. rate (!) 29, SpO2 98 %.  Physical Exam Constitutional:      General: He is not in acute distress.    Appearance: He is well-developed.     Interventions: Cervical collar in place.     Comments: Lying in the bed with head of bed elevated;  pleasant.   Neck:     Comments: Surgical site approximated and clean and dry.  Cardiovascular:     Rate and Rhythm: Normal rate and regular rhythm.     Heart sounds: Normal heart sounds.  Pulmonary:     Effort: Pulmonary effort is normal.     Breath sounds: Normal breath sounds.  Skin:    General: Skin is warm and dry.  Neurological:     Mental Status: He is alert and oriented to person, place, and time.  Psychiatric:        Behavior: Behavior normal.        Thought Content: Thought content normal.        Judgment: Judgment normal.     Lab Results Lab Results  Component Value Date   WBC 7.9 09/26/2022   HGB 11.4 (L) 09/26/2022   HCT 33.6 (L) 09/26/2022   MCV 99.7 09/26/2022   PLT 435 (H) 09/26/2022    Lab Results  Component Value Date   CREATININE 0.91 09/26/2022   BUN 12 09/26/2022   NA 132 (L) 09/26/2022   K 3.6 09/26/2022   CL 102 09/26/2022   CO2 19 (L) 09/26/2022    Lab Results  Component Value Date   ALT 17 09/26/2022   AST 24 09/26/2022   ALKPHOS 54 09/26/2022   BILITOT 0.7 09/26/2022     Microbiology: Recent Results (from the past 240 hour(s))  Surgical pcr screen     Status: None   Collection Time: 09/26/22 12:24 PM   Specimen: Nasal Mucosa; Nasal Swab  Result Value Ref Range Status   MRSA, PCR NEGATIVE NEGATIVE Final   Staphylococcus aureus NEGATIVE NEGATIVE Final    Comment: (NOTE) The Xpert SA Assay (FDA approved for NASAL specimens in patients 53 years of age and older), is one component of a comprehensive surveillance program. It is not intended to diagnose infection nor to guide or monitor treatment. Performed at [facility], [address], [place], [place] [address]      Marcos Eke, NP [facility]  09/26/2022  3:04 PM

## 2022-09-26 NOTE — Transfer of Care (Signed)
Immediate Anesthesia Transfer of Care Note  Patient: Larry Lester  Procedure(s) Performed: ANTERIOR NECK EXPLORATION  Patient Location: PACU  Anesthesia Type:General  Level of Consciousness: awake and alert   Airway & Oxygen Therapy: Patient Spontanous Breathing  Post-op Assessment: Report given to RN and Post -op Vital signs reviewed and stable  Post vital signs: Reviewed and stable  Last Vitals:  Vitals Value Taken Time  BP 130/81 09/26/22 1425  Temp 36.7 C 09/26/22 1425  Pulse 94 09/26/22 1429  Resp 16 09/26/22 1429  SpO2 98 % 09/26/22 1429  Vitals shown include unvalidated device data.  Last Pain:  Vitals:   09/26/22 1208  TempSrc: Oral  PainSc:          Complications: No notable events documented.

## 2022-09-26 NOTE — ED Notes (Signed)
Pt reports muscle cramping x2 weeks, reports difficulty walking that he noticed tonight. Denies urinary or bowel incontinence.

## 2022-09-26 NOTE — ED Notes (Signed)
New c-collar placed for comfort

## 2022-09-26 NOTE — Op Note (Signed)
09/26/2022  3:17 PM  PATIENT:  Larry Lester  52 y.o. male With C6,7 osteomyelitis and discitis PRE-OPERATIVE DIAGNOSIS:  CERVICAL DISCITIS  POST-OPERATIVE DIAGNOSIS:  CERVICAL DISCITIS  PROCEDURE:  Procedure(s): ANTERIOR NECK EXPLORATION  SURGEON: Surgeon(s): Coletta Memos, MD  ASSISTANTS:none  ANESTHESIA:   general  EBL:  Total I/O In: 1000 [I.V.:1000] Out: 15 [Blood:15]  BLOOD ADMINISTERED:none  CELL SAVER GIVEN:not used  COUNT:correct per nursing  DRAINS: none   SPECIMEN:  Source of Specimen:  cervical prevertebral soft tisssue  DICTATION: Larry Lester was taken to the operating room, intubated, and placed under a general anesthetic without difficulty. He was positioned supine with his head on a horseshoe head rest. His neck was prepped and draped in a sterile manner.  I made a horizontal incision at the cricothyroid cartilage. I dissected sharply to the platysma and divided it sharply in the horizontal plane. I dissected bluntly through the cervical musculature and created an avascular pathway to the vertebrae and prevertebral space. I took biopsies of the prevertebral soft tissue which was obviously abnormal. I used cautery to obtain hemostasis. I approximated the platysma, then the skin edges with vicryl sutures. I used dermabond for a sterile dressing. Specimens were sent to microbiology for gram stain and culture.  PLAN OF CARE: Admit to inpatient   PATIENT DISPOSITION:  PACU - hemodynamically stable.   Delay start of Pharmacological VTE agent (>24hrs) due to surgical blood loss or risk of bleeding:  no

## 2022-09-26 NOTE — ED Triage Notes (Signed)
Pt from home with EMS; c/o neck and back pain. Was trying to clean the bathroom and slipped and fell multiple times. C/o lower back pain. C-collar in place

## 2022-09-26 NOTE — H&P (Signed)
Date: 09/26/2022               Patient Name:  Larry Lester MRN: [id number]  DOB: 01/24/1971 Age / Sex: 52 y.o., male   PCP: Pcp, No         Medical Service: Internal Medicine Teaching Service         Attending Physician: Dr. Earl Lagos, MD    First Contact: Dr. Lajuana Ripple Pager: [phone]  Second Contact: Dr. Champ Mungo Pager: [phone]       After Hours (After 5p/  First Contact Pager: [phone]  weekends / holidays): Second Contact Pager: [phone]   Chief Complaint: Neck pain  History of Present Illness:   Larry Lester is a 52 year old male living with substance use disorder who presented to the emergency room for neck and lower back pain after falling multiple times a day.  Patient is in acute distress from neck and back pain during examination.  Patient reports neck pain and spasm for the last 2 weeks with associated bilateral arms weakness and paresthesia.  He reports fever and chills as well.  He also has lower extremities paresthesia but denies ataxia.  His partner states that patient has been hunching over when he walks recently.  He denies any past trauma or surgery of his cervical region.  He denies IV drug use.  He only uses cocaine occasionally when he can afford it.  Patient was in the ED a few times in April for back pain and request muscle relaxants.   Patient takes no chronic medications.  He will use Tylenol, Advil for his pain.  In the ED, patient was normotensive and afebrile.  He has no leukocytosis with a mild normocytic anemia.  CMP is unremarkable.  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  MRI cervical showed severe discitis osteomyelitis at the same level with a ventral epidural phlegmon and dural thickening.  Neuro surgery was consulted and will take patient to the OR for tissue sampling before starting antibiotics.  Meds:  Current Meds  Medication Sig   acetaminophen (TYLENOL) 500 MG  tablet Take 500 mg by mouth every 6 (six) hours as needed for mild pain.   hydrOXYzine (ATARAX) 25 MG tablet Take 1 tablet (25 mg total) by mouth every 6 (six) hours as needed for itching.   ibuprofen (ADVIL) 600 MG tablet Take 1 tablet (600 mg total) by mouth every 6 (six) hours as needed.   methocarbamol (ROBAXIN) 500 MG tablet Take 1 tablet (500 mg total) by mouth 2 (two) times daily.     Allergies: Allergies as of 09/26/2022 - Review Complete 09/26/2022  Allergen Reaction Noted   Crab [shellfish allergy] Anaphylaxis 08/17/2014   History reviewed. No pertinent past medical history.  Family History:  -Mother: hypertension and diabetes  Social History:  -Lives with his girlfriend of 10 years.  He has 8 children -Independence with ADLs -He smokes cigars -No alcohol use -Smoke cocaine occasionally.  No IV drug use -PCP: N/A  Review of Systems: A complete ROS was negative except as per HPI.   Physical Exam: Blood pressure 112/74, pulse 79, temperature 98.4 F (36.9 C), temperature source Oral, resp. rate 17, SpO2 98 %. Physical Exam Constitutional:      Appearance: He is not toxic-appearing.     Comments: Middle-age male lying on bed with a c-collar, in acute distress due to pain.  HENT:     Head: Normocephalic.  Eyes:  General:        Right eye: No discharge.        Left eye: No discharge.     Conjunctiva/sclera: Conjunctivae normal.  Neck:     Comments: C-collar in place Cardiovascular:     Rate and Rhythm: Normal rate and regular rhythm.  Pulmonary:     Effort: Pulmonary effort is normal. No respiratory distress.     Breath sounds: Normal breath sounds. No wheezing.  Abdominal:     General: Bowel sounds are normal. There is no distension.     Palpations: Abdomen is soft.  Musculoskeletal:     Right lower leg: No edema.     Left lower leg: No edema.  Skin:    General: Skin is warm.  Neurological:     Mental Status: He is alert.     Comments: 5/5 strength  of bilateral lower extremities.  Good range of motion.  He has diffuse tenderness to palpation of the lower legs. Limited range of motion of bilateral upper extremities due to pain and weakness.  3/5 strength.  Psychiatric:        Mood and Affect: Mood normal.     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  Acute/subacute C6/C7 discitis osteomyelitis Epidural phlegmon Patient denies history of trauma/surgical intervention of cervical region or IV drug use.  Neurosurgery will obtain a tissue sample to help narrow antibiotic regimen.  Otherwise no procedure done for his vertebral body erosions. -Will start broad-spectrum antibiotic after the procedure with vancomycin and ceftriaxone until sample culture results -Follow-up blood culture -Will consult ID to help with antibiotic regimen -Will also obtain MRI of the lumbar to rule out osteomyelitis/discitis since he has significant pain of the lower back and paresthesia of bilateral lower extremities. -Pain control with Tylenol, oxycodone and Dilaudid as needed. He may benefit from muscle relaxant as well.   Substance use disorder -Obtain UDS  Mild normocytic anemia -Obtain ferritin and iron study  Full code DVT: Holding while having surgery IVF: N/A Diet: N.p.o.  Dispo: Admit patient to Inpatient with expected length of stay greater than 2 midnights.  Signed: Doran Stabler, DO 09/26/2022, 12:12 PM  Pager: [phone] After 5pm on weekdays and 1pm on weekends: On Call pager: [phone]

## 2022-09-26 NOTE — Anesthesia Postprocedure Evaluation (Signed)
Anesthesia Post Note  Patient: Larry Lester  Procedure(s) Performed: ANTERIOR NECK EXPLORATION     Patient location during evaluation: PACU Anesthesia Type: General Level of consciousness: sedated and patient cooperative Pain management: pain level controlled Vital Signs Assessment: post-procedure vital signs reviewed and stable Respiratory status: spontaneous breathing Cardiovascular status: stable Anesthetic complications: no   No notable events documented.  Last Vitals:  Vitals:   09/26/22 1515 09/26/22 1533  BP: 121/75 121/78  Pulse: 73 69  Resp: 11 16  Temp: 36.7 C 36.9 C  SpO2: 96% 96%    Last Pain:  Vitals:   09/26/22 1531  TempSrc:   PainSc: 10-Worst pain ever                 Lewie Loron

## 2022-09-26 NOTE — Progress Notes (Signed)
Pharmacy Antibiotic Note  Larry Lester is a 52 y.o. male admitted on 09/26/2022 with severe discitis/osteomyelitis of C6-C7 s/p open biopsy.  Pharmacy has been consulted for vancomycin dosing.  Plan: Vancomycin 1500mg  IV x 1, then 750mg  IV q12h for estimated AUC 451 using SCr 0.91 Check vancomycin levels at steady state, goal AUC 400-550 CTX 2g IV q24h per MD Follow up renal function & cultures    Temp (24hrs), Avg:98 F (36.7 C), Min:97.7 F (36.5 C), Max:98.4 F (36.9 C)  Recent Labs  Lab 09/26/22 0556 09/26/22 0613  WBC 7.9  --   CREATININE 0.91  --   LATICACIDVEN  --  0.8    CrCl cannot be calculated (Unknown ideal weight.).    Allergies  Allergen Reactions   Crab [Shellfish Allergy] Anaphylaxis    Antimicrobials this admission: 5/2 Vancomycin >> 5/2 CTX >>  Dose adjustments this admission:  Microbiology results: 5/2 BCx: 5/2 Tissue:  Thank you for allowing pharmacy to be a part of this patient's care.  Loralee Pacas, PharmD, BCPS Please see amion for complete clinical pharmacist phone list 09/26/2022 3:09 PM

## 2022-09-26 NOTE — ED Notes (Signed)
Procedural consent signed by MD and pt, witnessed by this RN.

## 2022-09-27 ENCOUNTER — Inpatient Hospital Stay (HOSPITAL_COMMUNITY): Payer: Commercial Managed Care - HMO

## 2022-09-27 ENCOUNTER — Encounter (HOSPITAL_COMMUNITY): Payer: Self-pay | Admitting: Neurosurgery

## 2022-09-27 DIAGNOSIS — M4642 Discitis, unspecified, cervical region: Secondary | ICD-10-CM | POA: Diagnosis not present

## 2022-09-27 DIAGNOSIS — M4622 Osteomyelitis of vertebra, cervical region: Secondary | ICD-10-CM | POA: Diagnosis not present

## 2022-09-27 LAB — CBC
HCT: 31.7 % — ABNORMAL LOW (ref 39.0–52.0)
Hemoglobin: 10.8 g/dL — ABNORMAL LOW (ref 13.0–17.0)
MCH: 34.3 pg — ABNORMAL HIGH (ref 26.0–34.0)
MCHC: 34.1 g/dL (ref 30.0–36.0)
MCV: 100.6 fL — ABNORMAL HIGH (ref 80.0–100.0)
Platelets: 381 10*3/uL (ref 150–400)
RBC: 3.15 MIL/uL — ABNORMAL LOW (ref 4.22–5.81)
RDW: 12.5 % (ref 11.5–15.5)
WBC: 8.5 10*3/uL (ref 4.0–10.5)
nRBC: 0 % (ref 0.0–0.2)

## 2022-09-27 LAB — CULTURE, BLOOD (ROUTINE X 2)
Culture: NO GROWTH
Culture: NO GROWTH

## 2022-09-27 LAB — IRON AND TIBC
Iron: 67 ug/dL (ref 45–182)
Saturation Ratios: 30 % (ref 17.9–39.5)
TIBC: 223 ug/dL — ABNORMAL LOW (ref 250–450)
UIBC: 156 ug/dL

## 2022-09-27 LAB — BASIC METABOLIC PANEL
Anion gap: 6 (ref 5–15)
BUN: 27 mg/dL — ABNORMAL HIGH (ref 6–20)
CO2: 20 mmol/L — ABNORMAL LOW (ref 22–32)
Calcium: 8.1 mg/dL — ABNORMAL LOW (ref 8.9–10.3)
Chloride: 108 mmol/L (ref 98–111)
Creatinine, Ser: 1.07 mg/dL (ref 0.61–1.24)
GFR, Estimated: >60 mL/min (ref >60)
Glucose, Bld: 131 mg/dL — ABNORMAL HIGH (ref 70–99)
Potassium: 4.2 mmol/L (ref 3.5–5.1)
Sodium: 134 mmol/L — ABNORMAL LOW (ref 135–145)

## 2022-09-27 LAB — RAPID URINE DRUG SCREEN, HOSP PERFORMED
Amphetamines: NOT DETECTED
Barbiturates: NOT DETECTED
Benzodiazepines: NOT DETECTED
Cocaine: POSITIVE — AB
Opiates: NOT DETECTED
Tetrahydrocannabinol: NOT DETECTED

## 2022-09-27 LAB — HIV ANTIBODY (ROUTINE TESTING W REFLEX): HIV Screen 4th Generation wRfx: NONREACTIVE

## 2022-09-27 LAB — AEROBIC/ANAEROBIC CULTURE W GRAM STAIN (SURGICAL/DEEP WOUND)

## 2022-09-27 LAB — SURGICAL PATHOLOGY

## 2022-09-27 LAB — FERRITIN: Ferritin: 390 ng/mL — ABNORMAL HIGH (ref 24–336)

## 2022-09-27 MED ORDER — OXYCODONE HCL 5 MG PO TABS: 10.0000 mg | ORAL_TABLET | ORAL | Status: DC | PRN

## 2022-09-27 MED ORDER — OXYCODONE HCL 5 MG PO TABS
5.0000 mg | ORAL_TABLET | ORAL | Status: DC | PRN
Start: 2022-09-27 — End: 2022-09-27

## 2022-09-27 MED ORDER — GADOBUTROL 1 MMOL/ML IV SOLN
8.0000 mL | Freq: Once | INTRAVENOUS | Status: AC | PRN
  Administered 2022-09-27: 8 mL via INTRAVENOUS

## 2022-09-27 MED ORDER — OXYCODONE HCL 5 MG PO TABS
10.0000 mg | ORAL_TABLET | ORAL | Status: DC | PRN
  Administered 2022-09-27 – 2022-09-28 (×3): 10 mg via ORAL
  Filled 2022-09-27 (×3): qty 2

## 2022-09-27 MED ORDER — OXYCODONE HCL 5 MG PO TABS: 5.0000 mg | ORAL_TABLET | ORAL | Status: DC | PRN

## 2022-09-27 MED FILL — Thrombin For Soln 5000 Unit: CUTANEOUS | Qty: 2 | Status: AC

## 2022-09-27 NOTE — Hospital Course (Addendum)
   HD#1 SUBJECTIVE:  Patient Summary: Larry Lester is a 52 y.o. with a pertinent PMH of substance use disorder (cocaine, denies IVDU), who presented with neck and low back pain after several falls at home and admitted for C6-C7 discitis/osteomyelitis.   Overnight Events: ***  Interim History: ***  OBJECTIVE:  Vital Signs: Vitals:   09/26/22 1533 09/26/22 1932 09/27/22 0446 09/27/22 0839  BP: 121/78 116/72 110/73 111/76  Pulse: 69 75 76 83  Resp: 16   18  Temp: 98.5 F (36.9 C) 98.4 F (36.9 C)  98 F (36.7 C)  TempSrc:  Oral  Oral  SpO2: 96% 100% 100% 100%   Supplemental O2: {NAMES:3044014::"Room Air","Nasal Cannula","Simple Face Mask","Partial Rebreather","HFNC","Non Rebreather","Venturi Mask","Bag Valve Mask"} SpO2: 100 %  There were no vitals filed for this visit.   Intake/Output Summary (Last 24 hours) at 09/27/2022 1457 Last data filed at 09/27/2022 0908 Gross per 24 hour  Intake --  Output 400 ml  Net -400 ml   Net IO Since Admission: -165 mL [09/27/22 1457]  Physical Exam: Physical Exam  Patient Lines/Drains/Airways Status     Active Line/Drains/Airways     Name Placement date Placement time Site Days   Peripheral IV 09/26/22 20 G Posterior;Proximal;Right Forearm 09/26/22  0611  Forearm  1   Peripheral IV 09/26/22 18 G Anterior;Left;Proximal Forearm 09/26/22  1158  Forearm  1   Incision (Closed) 09/26/22 Neck 09/26/22  1411  -- 1             ASSESSMENT/PLAN:  Assessment: Principal Problem:   Discitis of cervical region Active Problems:   Substance use disorder   Cervical discitis   Plan: C6-7 discitis and osteomyelitis Epidural phlegmon Punctate prevertebral abscesses Mild-moderate spinal stenosis  Cervical radiculopathy Findings noted on MRI. Neurosurgery obtained culture of prevertebral abscesses 05/02 and did not feel further intervention was needed at that time. Surgical pathology were consistent with abscess. Culture results ***.  Currently on vancomycin and ceftriaxone. -ID consulted, appreciate their recommendations -Continue celecoxib for inflammation reduction, oxycodone PRN and tylenol for pain -Continue vancomycin, ceftriaxone; narrow as able -F/u surgical culture.   Substance use disorder UDS not yet collected***. Patient denies IVDU.   Normocytic anemia Hgb stable, ***. Iron studies consistent with anemia of chronic disease. -Trend CBC  Best Practice: Diet: Regular diet IVF: None VTE: SCDs Start: 09/26/22 1624 heparin injection 5,000 Units Start: 09/26/22 1500 SCD's Start: 09/26/22 1450 Code: Full AB: *** DISPO: Anticipated discharge to pending IV antibiotics and medical stability.  Signature: Champ Mungo, D.O.  Internal Medicine Resident, PGY-2 [facility]  Pager: [phone]   Please contact the on call pager after 5 pm and on weekends at [phone].

## 2022-09-27 NOTE — Progress Notes (Signed)
[facility]   Reason for visit: Follow up on discitis/osteomyelitis  Interval History: no acute events.  WBC 8.5, culture with no growth to date.   Day 2 vancomycin and ceftriaxone  Physical Exam: Constitutional:  Vitals:   09/27/22 0446 09/27/22 0839  BP: 110/73 111/76  Pulse: 76 83  Resp:  18  Temp:  98 F (36.7 C)  SpO2: 100% 100%   patient appears in NAD Eyes: anicteric HENT: + neck brace Respiratory: Normal respiratory effort  Review of Systems: Constitutional: negative for fevers and chills  Lab Results  Component Value Date   WBC 8.5 09/27/2022   HGB 10.8 (L) 09/27/2022   HCT 31.7 (L) 09/27/2022   MCV 100.6 (H) 09/27/2022   PLT 381 09/27/2022    Lab Results  Component Value Date   CREATININE 1.07 09/27/2022   BUN 27 (H) 09/27/2022   NA 134 (L) 09/27/2022   K 4.2 09/27/2022   CL 108 09/27/2022   CO2 20 (L) 09/27/2022    Lab Results  Component Value Date   ALT 17 09/26/2022   AST 24 09/26/2022   ALKPHOS 54 09/26/2022     Microbiology: Recent Results (from the past 240 hour(s))  Culture, blood (routine x 2)     Status: None (Preliminary result)   Collection Time: 09/26/22  6:01 AM   Specimen: BLOOD  Result Value Ref Range Status   Specimen Description BLOOD BLOOD RIGHT HAND  Final   Special Requests   Final    BOTTLES DRAWN AEROBIC AND ANAEROBIC Blood Culture adequate volume   Culture   Final    NO GROWTH 1 DAY Performed at [facility], [address], [place], [place] [address]    Report Status PENDING  Incomplete  Culture, blood (routine x 2)     Status: None (Preliminary result)   Collection Time: 09/26/22  6:13 AM   Specimen: BLOOD RIGHT ARM  Result Value Ref Range Status   Specimen Description BLOOD RIGHT ARM  Final   Special Requests   Final    BOTTLES DRAWN AEROBIC AND ANAEROBIC Blood Culture adequate volume   Culture   Final    NO GROWTH 1 DAY Performed at [facility], [address] [place], [place] [address]    Report Status PENDING  Incomplete  Surgical pcr screen     Status: None   Collection Time: 09/26/22 12:24 PM   Specimen: Nasal Mucosa; Nasal Swab  Result Value Ref Range Status   MRSA, PCR NEGATIVE NEGATIVE Final   Staphylococcus aureus NEGATIVE NEGATIVE Final    Comment: (NOTE) The Xpert SA Assay (FDA approved for NASAL specimens in patients 25 years of age and older), is one component of a comprehensive surveillance program. It is not intended to diagnose infection nor to guide or monitor treatment. Performed at [facility], [address], [place], [place] [address]   Aerobic/Anaerobic Culture w Gram Stain (surgical/deep wound)     Status: None (Preliminary result)   Collection Time: 09/26/22  1:44 PM   Specimen: Soft Tissue, Other  Result Value Ref Range Status   Specimen Description TISSUE  Final   Special Requests SWAB OF PREVERTEBRAL TISSUE NO 1  Final   Gram Stain NO WBC SEEN NO ORGANISMS SEEN   Final   Culture   Final    CULTURE REINCUBATED FOR BETTER GROWTH Performed at [facility], [address], [place], [place] [address]  Report Status PENDING  Incomplete  Aerobic/Anaerobic Culture w Gram Stain (surgical/deep wound)     Status: None (Preliminary result)   Collection Time: 09/26/22  1:58 PM   Specimen: Soft Tissue, Other  Result Value Ref Range Status   Specimen Description TISSUE  Final   Special Requests SWAB OF PREVERTEBRAL SOFFT TISSUE NO 2  Final   Gram Stain NO WBC SEEN NO ORGANISMS SEEN   Final   Culture   Final    CULTURE REINCUBATED FOR BETTER GROWTH Performed at [facility], [address] [place], [place] [address]    Report Status PENDING  Incomplete    Impression/Plan:  1. Discitis/osteomyelitis of cervical region - severe disease, most c/w infection and cultures sent by Dr. Franky Macho.  No growth yet and on empiric antibiotics with vancomycin and ceftriaxone.  Will continue to monitor  ccultures  2. Medication monitoring - will continue to monitor creat on vancomycin.    3.  Substance abuse - occasional cocaine use.  Drug screen not yet sent.    Dr. Thedore Mins is available over the weekend if needed, otherwise the ID team will follow up on Monday

## 2022-09-27 NOTE — Evaluation (Signed)
Occupational Therapy Evaluation Patient Details Name: Larry Lester MRN: [id number] DOB: 08/16/1970 Today's Date: 09/27/2022   History of Present Illness 52 y/o male with complaints of neck and back pain after experiencing a mechanical fall at home. Cervical CT revealed evidence of C6-7 discitis and osteomyelitis with associated prevertebral soft tissue swelling. Subsequent MRI demonstrated severe C6-C7 discitis-osteomyelitis, eroded endplates, epidural phlegmon, two punctate prevertebral abscesses, and mild-moderate spinal stenosis with cord compression. Lumbar MRI only notable for mild L5-S1 degeneration. Taken to OR for tissue sampling.   Clinical Impression   Pt admitted with the above diagnosis. Pt currently with functional limitations due to the deficits listed below (see OT Problem List). Prior to admit, pt reports that he was independent with all ADL tasks and functional mobility. He was currently staying in a hotel and states that his significant other is working on getting an apartment for them both. Pt will benefit from acute skilled OT to increase their safety and independence with ADL and functional mobility for ADL to facilitate discharge. Patient will benefit from intensive inpatient follow up therapy, >3 hours/day. OT will continue to follow patient acutely.         Recommendations for follow up therapy are one component of a multi-disciplinary discharge planning process, led by the attending physician.  Recommendations may be updated based on patient status, additional functional criteria and insurance authorization.   Assistance Recommended at Discharge Intermittent Supervision/Assistance  Patient can return home with the following A little help with walking and/or transfers;A little help with bathing/dressing/bathroom;Assistance with feeding;Help with stairs or ramp for entrance;Assist for transportation;Assistance with cooking/housework    Functional Status Assessment   Patient has had a recent decline in their functional status and demonstrates the ability to make significant improvements in function in a reasonable and predictable amount of time.  Equipment Recommendations  Other (comment) (defer to next venue of care)    Recommendations for Other Services Rehab consult;PT consult     Precautions / Restrictions Precautions Precautions: Cervical;Fall Precaution Booklet Issued: Yes (comment) Precaution Comments: verbal instructions and handout provided Required Braces or Orthoses: Cervical Brace Cervical Brace: Hard collar;Other (comment) (may remove in bed, when walking to/from bathroom, and when in the shower.) Restrictions Weight Bearing Restrictions: No      Mobility Bed Mobility Overal bed mobility: Needs Assistance Bed Mobility: Rolling, Sidelying to Sit Rolling: Min assist Sidelying to sit: Min assist       General bed mobility comments: VC for log roll technique, for hand placement on bed rail and assist to bring trunk up off bed into sitting Patient Response: Cooperative  Transfers Overall transfer level: Needs assistance Equipment used: 1 person hand held assist Transfers: Sit to/from Stand, Bed to chair/wheelchair/BSC Sit to Stand: Min assist     Step pivot transfers: Min assist     General transfer comment: Due to left wrist weakness and decreased stability, therapist provided hand held support to Left wrist while provided VC to push off of bed using RUE.      Balance Overall balance assessment: Needs assistance Sitting-balance support: Feet supported, No upper extremity supported Sitting balance-Leahy Scale: Fair Sitting balance - Comments: sitting EOB. When challenged during LE MMT, pt would lean posteriorly and VC were provided to correct posture before continuing. Postural control: Posterior lean Standing balance support: Single extremity supported, During functional activity Standing balance-Leahy Scale: Poor       ADL either performed or assessed with clinical judgement   ADL Overall ADL's : Needs assistance/impaired  Eating/Feeding: Sitting;Minimal assistance   Grooming: Wash/dry hands;Wash/dry face;Oral care;Applying deodorant;Moderate assistance;Sitting   Upper Body Bathing: Moderate assistance;Sitting   Lower Body Bathing: Sit to/from stand;Sitting/lateral leans;Moderate assistance   Upper Body Dressing : Moderate assistance;Sitting;Maximal assistance   Lower Body Dressing: Moderate assistance;Sit to/from stand;Sitting/lateral leans       Toileting- Clothing Manipulation and Hygiene: Total assistance;Sit to/from stand;Sitting/lateral lean               Vision Baseline Vision/History: 0 No visual deficits Ability to See in Adequate Light: 1 Impaired (has slight difficulty. Reports he may reading glasses) Patient Visual Report: No change from baseline Vision Assessment?: No apparent visual deficits            Pertinent Vitals/Pain Pain Assessment Pain Assessment: 0-10 Pain Score: 8  Pain Location: neck and bilateral hands and feet Pain Descriptors / Indicators: Pins and needles, Tingling Pain Intervention(s): Monitored during session, Repositioned     Hand Dominance Right   Extremity/Trunk Assessment Upper Extremity Assessment Upper Extremity Assessment: RUE deficits/detail;LUE deficits/detail RUE Deficits / Details: A/ROM WFL in all ranges. shoulder flexion 4/5, IR/er: 4/5, elbow flexion/extension: 4/5, wrist flexion/extension: 4/5, impaired gross grasp. RUE Sensation: decreased light touch RUE Coordination: decreased fine motor;decreased gross motor LUE Deficits / Details: A/ROM shoulder WFL, elbow flexion WFL, Unable to extend elbow fully. Wrist flexion full with gravity, wrist extension against gravity is ~75% full range. Able to form a gross grasp. MMT: Shoulder flexion: 3/5, IR/er: 4/5, elbow flexion 4/5, elbow extension: 3/5, wrist flexion/extension 3/5, impaired  gross grasp. LUE Sensation: decreased light touch LUE Coordination: decreased gross motor;decreased fine motor   Lower Extremity Assessment Lower Extremity Assessment: RLE deficits/detail RLE Deficits / Details: knee extension: 4-/5, hip flexion (seated): 4/5   Cervical / Trunk Assessment Cervical / Trunk Assessment: Neck Surgery   Communication Communication Communication: No difficulties   Cognition Arousal/Alertness: Awake/alert Behavior During Therapy: WFL for tasks assessed/performed Overall Cognitive Status: Within Functional Limits for tasks assessed                General Comments  Cervical collar fit adjusted when sitting on EOB. Educated pt on using replacement pads when needed, care management, and wearing schedule.    Exercises Other Exercises Other Exercises: Provided strengthening HEP including: green foam block for hand strength, A/ROM shoulder flexion, elbow flexion/extension, wrist supination/pronation, flexion/extension. Verbal instruction provided and pt verbalized understanding.        Home Living Family/patient expects to be discharged to:: Private residence Living Arrangements: Spouse/significant other (girflfriend) Available Help at Discharge: Friend(s);Available 24 hours/day;Family;Neighbor Type of Home: Other(Comment) (Currently living in a hotel. Girlfriend is currently working on getting an apartment for both of them to live at.)      Home Equipment: None          Prior Functioning/Environment Prior Level of Function : Independent/Modified Independent;History of Falls (last six months)  Mobility Comments: independent ADLs Comments: independent        OT Problem List: Decreased strength;Decreased coordination;Decreased range of motion;Decreased activity tolerance;Impaired balance (sitting and/or standing);Decreased knowledge of precautions;Impaired UE functional use      OT Treatment/Interventions: Self-care/ADL  training;Splinting;Therapeutic activities;Therapeutic exercise;Neuromuscular education;Energy conservation;Patient/family education;DME and/or AE instruction;Manual therapy;Balance training;Modalities    OT Goals(Current goals can be found in the care plan section) Acute Rehab OT Goals Patient Stated Goal: to get stronger OT Goal Formulation: Patient unable to participate in goal setting Time For Goal Achievement: 10/11/22 Potential to Achieve Goals: Good  OT Frequency: Min 2X/week  AM-PAC OT "6 Clicks" Daily Activity     Outcome Measure Help from another person eating meals?: A Little Help from another person taking care of personal grooming?: A Lot Help from another person toileting, which includes using toliet, bedpan, or urinal?: A Lot Help from another person bathing (including washing, rinsing, drying)?: A Lot Help from another person to put on and taking off regular upper body clothing?: A Lot Help from another person to put on and taking off regular lower body clothing?: A Lot 6 Click Score: 13   End of Session Equipment Utilized During Treatment: Gait belt;Cervical collar Nurse Communication: Mobility status  Activity Tolerance: Patient tolerated treatment well Patient left: in chair;with call bell/phone within reach;with chair alarm set  OT Visit Diagnosis: Unsteadiness on feet (R26.81);Muscle weakness (generalized) (M62.81);History of falling (Z91.81);Other symptoms and signs involving the nervous system (R29.898)                Time: 1610-9604 OT Time Calculation (min): 60 min Charges:  OT General Charges $OT Visit: 1 Visit OT Evaluation $OT Eval High Complexity: 1 High OT Treatments $Self Care/Home Management : 8-22 mins $Therapeutic Activity: 8-22 mins $Therapeutic Exercise: 8-22 mins  [name], OTR/L,CBIS  Supplemental OT - [facility] and [facility] Secure Chat Preferred    Kaelee Pfeffer, [name] March 09/27/2022, 1:45 PM

## 2022-09-27 NOTE — Evaluation (Signed)
Physical Therapy Evaluation Patient Details Name: Larry Lester MRN: [id number] DOB: 1970/08/16 Today's Date: 09/27/2022  History of Present Illness  Pt is a 52 y/o M admitted on 09/26/22 after presenting with c/o neck & lower back pain after falling multiple times/day. 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 PMH: substance use disorder  Clinical Impression  Pt seen for PT evaluation with pt agreeable. Pt reports prior to admission he was living with his fiance in a 2nd level apartment with full flight of stairs to access. Pt notes he was working at [employer] performing heavy lifting, wearing a back brace daily for support, and used the bus for transportation. On this date, pt presents with decreased sensation in BLE thighs, decreased grip strength & generalized BLE weakness. Pt requires min assist to ambulate without AD with 1-2 mild LOB with min assist to correct. Pt is able to perform 10x STS from EOB without BUE support & CGA. Pt reports he will have 24 hr assistance from fiance upon d/c. Recommend ongoing PT services to address high level balance, strengthening, gait & stair negotiation to reduce fall risk & increase independence with mobility.   Recommendations for follow up therapy are one component of a multi-disciplinary discharge planning process, led by the attending physician.  Recommendations may be updated based on patient status, additional functional criteria and insurance authorization.  Follow Up Recommendations       Assistance Recommended at Discharge Intermittent Supervision/Assistance  Patient can return home with the following  A little help with walking and/or transfers;A little help with bathing/dressing/bathroom;Assistance with cooking/housework;Assist for transportation;Help with stairs or ramp for entrance    Equipment Recommendations Rolling walker (2 wheels)  Recommendations for Other Services       Functional Status Assessment  Patient has had a recent decline in their functional status and demonstrates the ability to make significant improvements in function in a reasonable and predictable amount of time.     Precautions / Restrictions Precautions Precautions: Cervical;Fall Precaution Booklet Issued: Yes (comment) Precaution Comments: verbal instructions and handout provided Required Braces or Orthoses: Cervical Brace Cervical Brace: Hard collar;Other (comment) (may remove in bed, when walking to/from bathroom, and when in shower) Restrictions Weight Bearing Restrictions: No      Mobility  Bed Mobility Overal bed mobility: Needs Assistance Bed Mobility: Rolling, Sidelying to Sit, Sit to Sidelying Rolling: Supervision Sidelying to sit: Supervision, HOB elevated     Sit to sidelying: Supervision, HOB elevated General bed mobility comments: Pt able to recall technique for supine>sit, PT provides min cuing for sit>supine. Pt uses bed rails & HOB slightly elevated.    Transfers Overall transfer level: Needs assistance Equipment used: None Transfers: Sit to/from Stand Sit to Stand: Min guard, Min assist           General transfer comment: Pt is able to transfer STS from EOB with UE assist to push to standing and without BUE support with min assist fade to CGA.    Ambulation/Gait Ambulation/Gait assistance: Min guard, Min assist Gait Distance (Feet): 200 Feet Assistive device: None Gait Pattern/deviations: Decreased step length - right, Decreased step length - left, Decreased stride length, Decreased dorsiflexion - right, Decreased dorsiflexion - left Gait velocity: decreased     General Gait Details: Decreased hip/knee flexion during swing phase BLE, decreased BLE foot clearance & heel strike at times. 1-2 mild LOB with min assist to correct.  Stairs            Wheelchair  Mobility    Modified Rankin (Stroke Patients Only)       Balance Overall balance assessment: Needs assistance    Sitting balance-Leahy Scale: Good     Standing balance support: No upper extremity supported, During functional activity Standing balance-Leahy Scale: Poor                               Pertinent Vitals/Pain Pain Assessment Pain Assessment: 0-10 Pain Score: 5  Pain Location: LUE as session progresses Pain Descriptors / Indicators: Numbness Pain Intervention(s): Monitored during session, Repositioned, Limited activity within patient's tolerance    Home Living Family/patient expects to be discharged to:: Private residence Living Arrangements: Spouse/significant other (fiance) Available Help at Discharge: Family;Friend(s);Available 24 hours/day Type of Home: Apartment (Pt reports he lives in apartment on 2nd level) Home Access: Stairs to enter Entrance Stairs-Rails: Right;Left;None (wideset) Entrance Stairs-Number of Steps: flight   Home Layout: One level Home Equipment: None      Prior Function Prior Level of Function : Independent/Modified Independent;History of Falls (last six months);Working/employed             Mobility Comments: Pt reports he & his fiance use bus for transportation. Pt working at [employer], lifting heavy boxes/unloading trucks. Pt wears back brace for support every day. Independent without AD but reports multiple falls in the 2 weeks leading up to admission. ADLs Comments: independent     Hand Dominance        Extremity/Trunk Assessment   Upper Extremity Assessment Upper Extremity Assessment: Defer to OT evaluation (decreased grip strength BUE)    Lower Extremity Assessment Lower Extremity Assessment: Generalized weakness (decreased hip/knee flexion during gait (swing phase), decreased sensation to light touch in BLE thighs)    Cervical / Trunk Assessment Cervical / Trunk Assessment:  (in hard collar)  Communication   Communication: No difficulties  Cognition Arousal/Alertness: Awake/alert Behavior During Therapy: WFL for  tasks assessed/performed Overall Cognitive Status: Within Functional Limits for tasks assessed                                 General Comments: Very pleasant & motivated to participate.        General Comments General comments (skin integrity, edema, etc.): Cervical collar fit adjusted when sitting on EOB. Educated pt on using replacement pads when needed, care management, and wearing schedule.    Exercises Other Exercises Other Exercises: Pt performed 10x STS from EOB with decreasing EOB height. Pt performs transfers without BUE support with CGA assist with task focusing on BLE strengthening and endurance training.   Assessment/Plan    PT Assessment Patient needs continued PT services  PT Problem List Decreased strength;Decreased coordination;Pain;Impaired sensation;Decreased range of motion;Decreased activity tolerance;Decreased balance;Decreased mobility;Decreased knowledge of precautions;Decreased safety awareness;Decreased knowledge of use of DME       PT Treatment Interventions DME instruction;Gait training;Balance training;Therapeutic exercise;Stair training;Neuromuscular re-education;Functional mobility training;Therapeutic activities;Patient/family education    PT Goals (Current goals can be found in the Care Plan section)  Acute Rehab PT Goals Patient Stated Goal: get better, return to PLOF PT Goal Formulation: With patient Time For Goal Achievement: 10/11/22 Potential to Achieve Goals: Good    Frequency Min 4X/week     Co-evaluation               AM-PAC PT "6 Clicks" Mobility  Outcome Measure Help needed turning from your back to  your side while in a flat bed without using bedrails?: None Help needed moving from lying on your back to sitting on the side of a flat bed without using bedrails?: A Little Help needed moving to and from a bed to a chair (including a wheelchair)?: A Little Help needed standing up from a chair using your arms (e.g.,  wheelchair or bedside chair)?: A Little Help needed to walk in hospital room?: A Little Help needed climbing 3-5 steps with a railing? : A Little 6 Click Score: 19    End of Session Equipment Utilized During Treatment: Gait belt;Cervical collar Activity Tolerance: Patient tolerated treatment well Patient left: in bed;with call bell/phone within reach;with bed alarm set   PT Visit Diagnosis: Muscle weakness (generalized) (M62.81);Unsteadiness on feet (R26.81);Other abnormalities of gait and mobility (R26.89)    Time: 2440-1027 PT Time Calculation (min) (ACUTE ONLY): 18 min   Charges:   PT Evaluation $PT Eval Moderate Complexity: 1 Mod          Aleda Grana, PT, DPT 09/27/22, 3:18 PM   Sandi Mariscal 09/27/2022, 3:15 PM

## 2022-09-27 NOTE — Progress Notes (Signed)
Internal Medicine Attending:   I saw and examined the patient. I reviewed the resident's H&P note and I agree with the resident's findings and plan as documented in the resident's note.  In brief, patient is a 52 year old male with a past medical history of substance use disorder (cocaine but not IV drug use) who presented to the ED with neck and lower back pain after having multiple falls at home.  Patient was also noted to have bilateral arm weakness and paresthesias as well as paresthesias in his lower extremities..  In the ED, patient was noted to have C6/C7 discitis/osteomyelitis on imaging with ventral epidural phlegmon and dural thickening.  He was seen by neurosurgery and now is status post OR for tissue sampling.  Today, patient states that his weakness is improving slowly and his pain is better controlled.  On exam, patient is lying in bed in no apparent distress.  C-collar is intact.  Lungs are clear to auscultation bilaterally.  Abdomen is soft, nontender, nondistended with normoactive bowel sounds.  Cardiovascular exam reveals regular rate and rhythm with normal heart sounds.  Lower extremities are nontender to palpation with no edema noted.  Strength is 4 out of 5 in bilateral upper extremities with elbow flexion but 3 out of 5 with right elbow extension and 5 out of 5 in bilateral lower extremities.  Sensation is intact except mildly decreased over left upper extremity.  Patient has normal mood and affect.  He is oriented x 3.  Assessment and plan:  1.  Discitis/osteomyelitis at C6/C7: -Patient presented to ED with neck pain and bilateral upper extremity weakness and paresthesias after having multiple falls at home and was noted to have severe C6/C7 discitis/osteomyelitis, epidural phlegmon as well as 2 punctate prevertebral abscesses with mild to moderate spinal stenosis and cord mass effect. -He was evaluated by neurosurgery who took him to the OR for tissue sampling.  Abscesses were  nondrainable and no other intervention is required at this time per neurosurgery. -ID follow-up and recommendations appreciated.  Patient will likely require prolonged course of antibiotics.  Will continue with IV ceftriaxone and vancomycin for now -Blood cultures with no growth to date -Tissue culture growing rare strep pneumo in one of the samples.  Will continue to follow-up cultures.  May be able to narrow antibiotic coverage to ceftriaxone alone. -Continue with pain control for now -PT/OT to evaluate patient -No further workup at this time

## 2022-09-27 NOTE — Plan of Care (Signed)

## 2022-09-27 NOTE — Progress Notes (Signed)
? ?  Inpatient Rehab Admissions Coordinator : ? ?Per therapy recommendations, patient was screened for CIR candidacy by Collin Hendley RN MSN.  At this time patient appears to be a potential candidate for CIR. I will place a rehab consult per protocol for full assessment. Please call me with any questions. ? ?Jaimin Krupka RN MSN ?Admissions Coordinator ?[phone] ?  ?

## 2022-09-28 DIAGNOSIS — M4802 Spinal stenosis, cervical region: Secondary | ICD-10-CM

## 2022-09-28 DIAGNOSIS — F1990 Other psychoactive substance use, unspecified, uncomplicated: Secondary | ICD-10-CM

## 2022-09-28 DIAGNOSIS — M4642 Discitis, unspecified, cervical region: Secondary | ICD-10-CM | POA: Diagnosis not present

## 2022-09-28 DIAGNOSIS — D649 Anemia, unspecified: Secondary | ICD-10-CM

## 2022-09-28 DIAGNOSIS — M4622 Osteomyelitis of vertebra, cervical region: Secondary | ICD-10-CM | POA: Diagnosis not present

## 2022-09-28 LAB — AEROBIC/ANAEROBIC CULTURE W GRAM STAIN (SURGICAL/DEEP WOUND): Gram Stain: NONE SEEN

## 2022-09-28 LAB — CBC
HCT: 29.8 % — ABNORMAL LOW (ref 39.0–52.0)
Hemoglobin: 10.6 g/dL — ABNORMAL LOW (ref 13.0–17.0)
MCH: 34.6 pg — ABNORMAL HIGH (ref 26.0–34.0)
MCHC: 35.6 g/dL (ref 30.0–36.0)
MCV: 97.4 fL (ref 80.0–100.0)
Platelets: 360 10*3/uL (ref 150–400)
RBC: 3.06 MIL/uL — ABNORMAL LOW (ref 4.22–5.81)
RDW: 12.7 % (ref 11.5–15.5)
WBC: 6 10*3/uL (ref 4.0–10.5)
nRBC: 0 % (ref 0.0–0.2)

## 2022-09-28 LAB — BASIC METABOLIC PANEL
Anion gap: 5 (ref 5–15)
BUN: 16 mg/dL (ref 6–20)
CO2: 22 mmol/L (ref 22–32)
Calcium: 8.1 mg/dL — ABNORMAL LOW (ref 8.9–10.3)
Chloride: 107 mmol/L (ref 98–111)
Creatinine, Ser: 0.9 mg/dL (ref 0.61–1.24)
GFR, Estimated: >60 mL/min (ref >60)
Glucose, Bld: 108 mg/dL — ABNORMAL HIGH (ref 70–99)
Potassium: 4.1 mmol/L (ref 3.5–5.1)
Sodium: 134 mmol/L — ABNORMAL LOW (ref 135–145)

## 2022-09-28 LAB — CULTURE, BLOOD (ROUTINE X 2): Special Requests: ADEQUATE

## 2022-09-28 MED ORDER — OXYCODONE HCL 5 MG PO TABS: 5.0000 mg | ORAL_TABLET | ORAL | Status: DC | PRN

## 2022-09-28 MED ORDER — OXYCODONE HCL 5 MG PO TABS
10.0000 mg | ORAL_TABLET | ORAL | Status: DC | PRN
  Administered 2022-09-28 – 2022-09-30 (×8): 10 mg via ORAL
  Filled 2022-09-28 (×8): qty 2

## 2022-09-28 NOTE — Progress Notes (Signed)
Inpatient Rehab Admissions:  Inpatient Rehab Consult received.  I met with patient and significant other at the bedside for rehabilitation assessment and to discuss goals and expectations of an inpatient rehab admission.  Discussed average length of stay, insurance authorization requirement, self pay cost, discharge home after completion of CIR. Both acknowledged understanding. Pt interested in pursuing CIR and significant other supportive. Significant other, Marylene Land confirmed that she and other family will be able to provide 24/7 support for pt after discharge. Will continue to follow.  Signed: Wolfgang Phoenix, MS, CCC-SLP Admissions Coordinator [phone]

## 2022-09-28 NOTE — Plan of Care (Signed)

## 2022-09-28 NOTE — Progress Notes (Signed)
HD#1 SUBJECTIVE:  Patient Summary: Larry Lester is a 52 y.o. with a pertinent PMH of substance use disorder (cocaine, denies IVDU), who presented with neck and low back pain after several falls at home and admitted for C6-C7 discitis/osteomyelitis.   Overnight Events: None  Interim History: Patient complains of pain this morning. His proximal LUE is most bothersome at this time and was bothering him prior to admission, and began to bother him after he had several falls.  OBJECTIVE:  Vital Signs:    09/28/2022    8:05 AM 09/27/2022    9:26 PM 09/27/2022    7:15 PM  Vitals with BMI  Systolic 125 123 098  Diastolic 83 85 87  Pulse 66 81 88    Supplemental O2: Room Air SpO2: 100 %  Physical Exam: Constitutional:Laying in bed, heating pad over proximal LUE. In no acute distress. Cardio:Regular rate and rhythm. No murmurs, rubs, or gallops. Pulm:Normal work of breathing on room air. JXB:JYNWGNFA for extremity edema. Proximal LUE without obvious deformity, edema, or increased warmth. Cervical collar in place. Skin:Warm and dry. Neuro:Alert and oriented x3. No focal deficit noted. Psych:Pleasant mood and affect.  Patient Lines/Drains/Airways Status     Active Line/Drains/Airways     Name Placement date Placement time Site Days   Peripheral IV 09/26/22 20 G Posterior;Proximal;Right Forearm 09/26/22  0611  Forearm  1   Peripheral IV 09/26/22 18 G Anterior;Left;Proximal Forearm 09/26/22  1158  Forearm  1   Incision (Closed) 09/26/22 Neck 09/26/22  1411  -- 1             ASSESSMENT/PLAN:  Assessment: Principal Problem:   Discitis of cervical region Active Problems:   Substance use disorder   Cervical discitis   Plan: C6-7 discitis and osteomyelitis Epidural phlegmon Punctate prevertebral abscesses Mild-moderate spinal stenosis  Cervical radiculopathy Findings noted on MRI. Neurosurgery obtained culture of prevertebral abscesses 05/02 and did not feel further  intervention was needed at that time. Surgical pathology were consistent with abscess. Surgical sample culture results with rare streptococcus pnemoniae, reincubated for better growth. Blood culture with NGTD. Currently on ceftriaxone; vancomycin discontinued 05/03. He has received oxycodone 10 mg for severe pain for 2 doses and robaxin for muscle spasms for one dose; pain has been somewhat controlled on current regimen however he is still has pain that could be improved. Today his pain generator is the proximal LUE which has been painful since before admission after the numerous falls. I do not suspect fracture based on my exam, more likely deep tissue hematoma.  -ID consulted, appreciate their recommendations -Continue ceftriaxone; narrow further as able -F/u surgical culture -Continue celecoxib for inflammation reduction, oxycodone PRN and tylenol for pain  -Will not add additional pain control as he has not utilized full regimen; consider additional therapies if full extent of pain regimen is not helpful -Low threshold for imaging of LUE   Substance use disorder UDS positive for cocaine which patient endorsed use of at admission. Patient denies IVDU. -Encourage abstinence from illicit substances   Normocytic anemia Hgb stable, 10.6. Iron studies consistent with anemia of chronic disease. -Trend CBC  Best Practice: Diet: Regular diet IVF: None VTE: SCDs Start: 09/26/22 1624 heparin injection 5,000 Units Start: 09/26/22 1500 SCD's Start: 09/26/22 1450 Code: Full AB: Ceftriaxone DISPO: Anticipated discharge to pending IV antibiotics and medical stability.  Signature: Champ Mungo, D.O.  Internal Medicine Resident, PGY-2 [facility]  Pager: [phone]   Please contact the on  call pager after 5 pm and on weekends at [phone].

## 2022-09-29 DIAGNOSIS — M4622 Osteomyelitis of vertebra, cervical region: Secondary | ICD-10-CM | POA: Diagnosis not present

## 2022-09-29 DIAGNOSIS — M4642 Discitis, unspecified, cervical region: Secondary | ICD-10-CM | POA: Diagnosis not present

## 2022-09-29 DIAGNOSIS — F1990 Other psychoactive substance use, unspecified, uncomplicated: Secondary | ICD-10-CM | POA: Diagnosis not present

## 2022-09-29 DIAGNOSIS — M4802 Spinal stenosis, cervical region: Secondary | ICD-10-CM | POA: Diagnosis not present

## 2022-09-29 LAB — CBC
HCT: 31.8 % — ABNORMAL LOW (ref 39.0–52.0)
Hemoglobin: 11.5 g/dL — ABNORMAL LOW (ref 13.0–17.0)
MCH: 34.8 pg — ABNORMAL HIGH (ref 26.0–34.0)
MCHC: 36.2 g/dL — ABNORMAL HIGH (ref 30.0–36.0)
MCV: 96.4 fL (ref 80.0–100.0)
Platelets: 363 10*3/uL (ref 150–400)
RBC: 3.3 MIL/uL — ABNORMAL LOW (ref 4.22–5.81)
RDW: 12.5 % (ref 11.5–15.5)
WBC: 5.7 10*3/uL (ref 4.0–10.5)
nRBC: 0 % (ref 0.0–0.2)

## 2022-09-29 LAB — BASIC METABOLIC PANEL
Anion gap: 6 (ref 5–15)
BUN: 11 mg/dL (ref 6–20)
CO2: 24 mmol/L (ref 22–32)
Calcium: 8.5 mg/dL — ABNORMAL LOW (ref 8.9–10.3)
Chloride: 104 mmol/L (ref 98–111)
Creatinine, Ser: 0.77 mg/dL (ref 0.61–1.24)
GFR, Estimated: >60 mL/min (ref >60)
Glucose, Bld: 112 mg/dL — ABNORMAL HIGH (ref 70–99)
Potassium: 3.9 mmol/L (ref 3.5–5.1)
Sodium: 134 mmol/L — ABNORMAL LOW (ref 135–145)

## 2022-09-29 LAB — CULTURE, BLOOD (ROUTINE X 2): Special Requests: ADEQUATE

## 2022-09-29 LAB — AEROBIC/ANAEROBIC CULTURE W GRAM STAIN (SURGICAL/DEEP WOUND)

## 2022-09-29 NOTE — Progress Notes (Signed)
Subjective:  Larry Lester was feeling good this morning, continues to experience modest improvements in his weakness and sensation. Denies pain. Last bowel movement was about two days ago. Discussed culture findings and plan to continue current antibiotic regimen. Patient states that has never used intravenous drugs. Explained recommendation of acute inpatient rehabilitation. All questions were answered.    Objective: Vitals over previous 24hr: Vitals:   09/28/22 0805 09/28/22 1500 09/28/22 1947 09/29/22 0455  BP: 125/83 112/79 120/66 117/73  Pulse: 66 92 90 68  Resp: 17 18 18 16   Temp: 98.8 F (37.1 C) 98.8 F (37.1 C) 97.8 F (36.6 C) 98.1 F (36.7 C)  TempSrc: Oral Oral Oral Oral  SpO2: 100% 97% 100% 100%    General:      awake and alert, lying comfortably in bed, cooperative, not in acute distress Head:      cervical collar in place Lungs:      normal respiratory effort, breathing unlabored, symmetrical chest rise, no crackles or wheezing Cardiac:      regular rate and rhythm, normal S1 and S2, no pitting edema Abdomen:      soft and non-distended, normoactive bowel sounds, no tenderness to palpation or guarding Musculoskeletal:  motor strength 3+/5 with R elbow extension, 4+/5 with R elbow flexion, 4+/5 left elbow flexion and extension, 5/5 in RLE and LLE except 4+/5 L hip flexion Neurologic:      oriented to person-place-time, sensation to light touch intact and symmetrical across C6 dermatomes Psychiatric:      euthymic mood with congruent affect, intelligible speech    Assessment/Plan: Larry Lester is a 52 year old male with a past medical history of substance use disorder who presented with neck-back pain after experiencing a mechanical fall, now admitted for management of discitis-osteomyelitis and epidural phlegmon.    ---Discitis and osteomyelitis at C6-7 ---Punctate prevertebral abscesses ---Mild-moderate spinal stenosis  ---Epidural phlegmon Patient  presented with neck and back pain after experiencing a mechanical fall at home. Upon arrival, cervical CT revealed evidence of C6-7 discitis and osteomyelitis with associated prevertebral soft tissue swelling. Subsequent MRI demonstrated severe C6-C7 discitis-osteomyelitis, eroded endplates, epidural phlegmon, two punctate prevertebral abscesses, and mild-moderate spinal stenosis with cord compression. Lumbar MRI only notable for mild L5-S1 degeneration. Etiology remains unclear, patient reports occasionally smoking cocaine but denies any intravenous drug use. Urine drug screen only positive for cocaine. Intravenous ceftriaxone and vancomycin regimen was started. Neurosurgery obtained culture of prevertebral abscesses, deemed further intervention unnecessary. Celecoxib started to reduce inflammation, pain control with oxycodone. Antibiotic therapy will likely involve prolonged course administered intravenously. Tissue cultures only notable for rare S.pneumoniae, antibiotic regimen narrowed to ceftriaxone alone.  > Infectious disease consult, appreciate recommendations  > Ceftriaxone 2g q24  > Celecoxib 200mg  q12  > Oxycodone 5mg  q4 PRN +10mg  q4 PRN  > Acetaminophen 650mg  q4 PRN  > Follow culture, no growth d4   ---Acute decline in functional status  ---Cervical radiculopathy  Patient reports multiple recent falls at home and some baseline extremity weakness. Exam revealed decreased sensation in LUE and weakness in BUE most prominent on elbow extension. Findings are consistent with discitis and osteomyelitis at C6-C7 level. Physical and occupational therapy both recommending discharge to [facility], approval pending.  > Physical and occupational therapy  > Anticipate discharge to [facility]   ---Substance use disorder Patient reports frequent cigar and occasional cocaine use, both through inhalation route. Denies ever using any intravenous drugs. Urine drug screen positive for cocaine, which patient disclosed upon  arrival.  > Encourage cessation from cocaine   ---Normocytic anemia Upon arrival, laboratory testing demonstrated Hgb 11.4 slightly below baseline around 13-15. Iron panel collected 5-2 consistent with anemia of chronic disease, most likely secondary to osteomyelitis.   > Trend CBC q24    Principal Problem:   Discitis of cervical region Active Problems:   Substance use disorder   Cervical discitis    Prior to Admission Living Arrangement: home with girlfriend Anticipated Discharge Location: [facility] Barriers to Discharge: antibiotic regimen, intravenous route Dispo: Anticipated discharge in approximately 11-12 day(s).    Lajuana Ripple, MD Internal Medicine PGY-1 Pager [phone]  After 5pm on weekdays and 1pm on weekends: On Call pager [phone]

## 2022-09-30 ENCOUNTER — Inpatient Hospital Stay (HOSPITAL_COMMUNITY): Payer: Commercial Managed Care - HMO

## 2022-09-30 DIAGNOSIS — K0889 Other specified disorders of teeth and supporting structures: Secondary | ICD-10-CM

## 2022-09-30 DIAGNOSIS — M4622 Osteomyelitis of vertebra, cervical region: Secondary | ICD-10-CM | POA: Diagnosis not present

## 2022-09-30 DIAGNOSIS — F1490 Cocaine use, unspecified, uncomplicated: Secondary | ICD-10-CM

## 2022-09-30 DIAGNOSIS — M4642 Discitis, unspecified, cervical region: Secondary | ICD-10-CM | POA: Diagnosis not present

## 2022-09-30 LAB — CBC
HCT: 33.6 % — ABNORMAL LOW (ref 39.0–52.0)
Hemoglobin: 11.9 g/dL — ABNORMAL LOW (ref 13.0–17.0)
MCH: 34.5 pg — ABNORMAL HIGH (ref 26.0–34.0)
MCHC: 35.4 g/dL (ref 30.0–36.0)
MCV: 97.4 fL (ref 80.0–100.0)
Platelets: 352 10*3/uL (ref 150–400)
RBC: 3.45 MIL/uL — ABNORMAL LOW (ref 4.22–5.81)
RDW: 12.3 % (ref 11.5–15.5)
WBC: 5.5 10*3/uL (ref 4.0–10.5)
nRBC: 0 % (ref 0.0–0.2)

## 2022-09-30 LAB — CULTURE, BLOOD (ROUTINE X 2)

## 2022-09-30 MED ORDER — ENOXAPARIN SODIUM 40 MG/0.4ML IJ SOSY
40.0000 mg | PREFILLED_SYRINGE | INTRAMUSCULAR | Status: DC
  Administered 2022-09-30 – 2022-10-01 (×2): 40 mg via SUBCUTANEOUS
  Filled 2022-09-30 (×2): qty 0.4

## 2022-09-30 MED ORDER — PENICILLIN G POTASSIUM 20000000 UNITS IJ SOLR
12.0000 10*6.[IU] | Freq: Two times a day (BID) | INTRAVENOUS | Status: DC
  Administered 2022-09-30 – 2022-10-02 (×4): 12 10*6.[IU] via INTRAVENOUS
  Filled 2022-09-30 (×6): qty 12

## 2022-09-30 NOTE — Discharge Summary (Signed)
                 Interval history Neck still bothering him somewhat.  Eager to get the cervical stabilization collar off.  Otherwise no acute concerns.  Physical exam Blood pressure 127/83, pulse 90, temperature 97.7 F (36.5 C), temperature source Oral, resp. rate 17, SpO2 99 %.  Well-appearing, with cervical collar in place Heart rate is normal rhythm is regular, strong radial pulses Breathing is regular and unlabored on room air, anterior lung fields are clear Abdomen is nontender Skin is warm and dry Some tenderness over the left paraspinal area towards trapezius Alert and oriented Pleasant, jovial, concordant affect  Weight change:    Intake/Output Summary (Last 24 hours) at 09/30/2022 1239 Last data filed at 09/30/2022 0000 Gross per 24 hour  Intake 400 ml  Output 475 ml  Net -75 ml   Net IO Since Admission: 320.37 mL [09/30/22 1239]  Labs, images, and other studies White blood cells and hemoglobin stable  Assessment and plan Hospital day 4  Larry Lester is a 52 y.o. admitted for discitis and osteomyelitis of the C6-C7 region of the cervical spine, with cultures growing rare Streptococcus pneumoniae.  Principal Problem:   Discitis of cervical region Active Problems:   Substance use disorder   Cervical discitis  Cervical discitis and osteomyelitis at C6-C7 Epidural phlegmon Cervical radiculopathy Intraoperative cultures with strep pneumo.  Remains without signs of systemic infection or sepsis.  Will need prolonged course of IV antibiotics.  Appreciate ID assistance with this case.  Medically stable for discharge to acute inpatient rehab. - Continue IV ceftriaxone - Celecoxib 200 mg p.o. every 12 hours  Cocaine use disorder Person reports using inhaled cocaine.  Denies IV drug use. - Continue to encourage abstinence  Diet: Regular IVF: None VTE: enoxaparin (LOVENOX) injection 40 mg Start: 09/30/22 1500 SCDs Start: 09/26/22 1624 SCD's Start: 09/26/22  1450  Code: Full PT/OT recommendations: Acute inpatient rehab Family Update: Girlfriend at bedside  Discharge plan: To inpatient rehab as soon as bed becomes available  Marrianne Mood MD 09/30/2022, 12:39 PM  Pager: [phone] After 5pm or weekend: [phone]

## 2022-09-30 NOTE — Progress Notes (Signed)
Physical Therapy Treatment Patient Details Name: Larry Lester AGE MRN: [id number] DOB: 1970-11-02 Today's Date: 09/30/2022   History of Present Illness Pt is a 52 y/o M admitted on 09/26/22 after presenting with c/o neck & lower back pain after falling multiple times/day. 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 PMH: substance use disorder    PT Comments    Patient received in bed, he is agreeable to PT session. Patient is motivated to improve strength and get back his function. He is mod I with bed mobility. Transfers with supervision. Patient ambulating short distances in room without AD, however reaching for counter, bed, etc for stabilization. Patient ambulated 200 feet with RW. General instability noted in B LEs and trunk with ambulation requiring min guard. He ambulated up/down 6 steps with single rail and B UE support. Patient then performed LE strengthening exercises. He will continue to benefit from skilled PT to improve functional independence, balance and strength.      Recommendations for follow up therapy are one component of a multi-disciplinary discharge planning process, led by the attending physician.  Recommendations may be updated based on patient status, additional functional criteria and insurance authorization.  Follow Up Recommendations       Assistance Recommended at Discharge Intermittent Supervision/Assistance  Patient can return home with the following A little help with walking and/or transfers;A little help with bathing/dressing/bathroom;Assistance with cooking/housework;Assist for transportation;Help with stairs or ramp for entrance   Equipment Recommendations  Rolling walker (2 wheels)    Recommendations for Other Services       Precautions / Restrictions Precautions Precautions: Cervical;Fall Precaution Booklet Issued: Yes (comment) Precaution Comments: verbal instructions and handout provided Required Braces or  Orthoses: Cervical Brace Cervical Brace: Hard collar;Other (comment) (patient reports the MD took it off this morning, stating he did not need to wear it all the time anymore.) Restrictions Weight Bearing Restrictions: No     Mobility  Bed Mobility Overal bed mobility: Modified Independent Bed Mobility: Rolling, Sidelying to Sit Rolling: Modified independent (Device/Increase time) Sidelying to sit: Modified independent (Device/Increase time), HOB elevated            Transfers Overall transfer level: Needs assistance Equipment used: None Transfers: Sit to/from Stand Sit to Stand: Supervision           General transfer comment: Patient transfers STS with supervision. No physical assist provided.    Ambulation/Gait Ambulation/Gait assistance: Min guard Gait Distance (Feet): 200 Feet Assistive device: Rolling walker (2 wheels) Gait Pattern/deviations: Drifts right/left, Step-through pattern Gait velocity: decreased     General Gait Details: patient generally unsteady/weak with mobility. Not needing more than min guard support and RW for hallway ambulation.   Stairs Stairs: Yes Stairs assistance: Min guard Stair Management: One rail Left, Sideways Number of Stairs: 6 General stair comments: patient ambulated up/down steps with single left rail as this is how he performs stairs at home. LEs weak on steps   Wheelchair Mobility    Modified Rankin (Stroke Patients Only)       Balance Overall balance assessment: Needs assistance, Mild deficits observed, not formally tested Sitting-balance support: Feet supported Sitting balance-Leahy Scale: Good     Standing balance support: Bilateral upper extremity supported, During functional activity, Reliant on assistive device for balance Standing balance-Leahy Scale: Fair Standing balance comment: improved balance with use of AD. Patient demonstrates gross mild unsteadiness with mobility. No overt LOB, but at increased  fall risk.  Cognition Arousal/Alertness: Awake/alert Behavior During Therapy: WFL for tasks assessed/performed Overall Cognitive Status: Within Functional Limits for tasks assessed                                 General Comments: Very pleasant & motivated to participate.        Exercises Other Exercises Other Exercises: STS x 10 from mat table in gym supervision, no UE support. B Step ups 5x2 reps each side with B UE support, supervision.    General Comments        Pertinent Vitals/Pain Pain Assessment Pain Assessment: Faces Faces Pain Scale: Hurts a little bit Pain Descriptors / Indicators: Discomfort Pain Intervention(s): Monitored during session    Home Living                          Prior Function            PT Goals (current goals can now be found in the care plan section) Acute Rehab PT Goals Patient Stated Goal: get better, return to PLOF PT Goal Formulation: With patient Time For Goal Achievement: 10/11/22 Potential to Achieve Goals: Good Progress towards PT goals: Progressing toward goals    Frequency    Min 4X/week      PT Plan Current plan remains appropriate    Co-evaluation              AM-PAC PT "6 Clicks" Mobility   Outcome Measure  Help needed turning from your back to your side while in a flat bed without using bedrails?: None Help needed moving from lying on your back to sitting on the side of a flat bed without using bedrails?: A Little Help needed moving to and from a bed to a chair (including a wheelchair)?: A Little Help needed standing up from a chair using your arms (e.g., wheelchair or bedside chair)?: A Little Help needed to walk in hospital room?: A Little Help needed climbing 3-5 steps with a railing? : A Little 6 Click Score: 19    End of Session Equipment Utilized During Treatment: Gait belt;Cervical collar Activity Tolerance: Patient tolerated  treatment well Patient left: in bed;with call bell/phone within reach;with bed alarm set Nurse Communication: Mobility status PT Visit Diagnosis: Muscle weakness (generalized) (M62.81);Unsteadiness on feet (R26.81);Other abnormalities of gait and mobility (R26.89)     Time: 1105-1130 PT Time Calculation (min) (ACUTE ONLY): 25 min  Charges:  $Gait Training: 8-22 mins $Therapeutic Exercise: 8-22 mins                     Keland Peyton, PT, GCS 09/30/22,12:09 PM

## 2022-09-30 NOTE — Progress Notes (Signed)
Inpatient Rehab Admissions Coordinator:  Saw pt at bedside. Reviewed benefits.  Insurance authorization started. Will continue to follow.   Wolfgang Phoenix, MS, CCC-SLP Admissions Coordinator [phone]

## 2022-09-30 NOTE — Discharge Instructions (Signed)
To Mr. Larry Lester or their caretakers,  They were admitted to [facility] on 09/26/2022 for evaluation and treatment of:  Principal Problem:   Discitis of cervical region Active Problems:   Substance use disorder   Cervical discitis  Resolved Problems:   * No resolved hospital problems. *  The evaluation suggested an infection of the spinal bones in the region of the neck. They were treated IV antibiotics, pain medicine, and therapy.  They were discharged from the hospital on 09/30/22. I recommend the following after leaving the hospital:   Most important thing you can do for your health is to quit using cocaine as soon as possible.  Marrianne Mood MD 09/30/2022, 12:38 PM

## 2022-09-30 NOTE — Progress Notes (Signed)
Occupational Therapy Treatment Patient Details Name: Larry Lester MRN: [id number] DOB: [birth year] Today's Date: 09/30/2022   History of present illness Pt is a 52 y/o M admitted on 09/26/22 after presenting with c/o neck & lower back pain after falling multiple times/day. 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 PMH: substance use disorder   OT comments  Pt in bed upon therapy arrival and agreeable to participate in therapy session. Assessed BUE strength. Pt continues to present with BUE weakness L<R. Left shoulder strength slightly improved from initial evaluation. Pt completed standing grooming task at sink focusing on coordination. Right hand used primarily as pt is right handed. Pt provided with yellow theraputty and participated in hand strengthening activities including grasp/release, lateral pinch and 3 point pinch. Pt was educated on purpose and technique. Pt verbalized understanding. Pt continues to be appropriate for intensive inpatient follow up therapy, >3 hours/day. OT will continue to follow patient acutely.     Recommendations for follow up therapy are one component of a multi-disciplinary discharge planning process, led by the attending physician.  Recommendations may be updated based on patient status, additional functional criteria and insurance authorization.    Assistance Recommended at Discharge Intermittent Supervision/Assistance  Patient can return home with the following  A little help with walking and/or transfers;A little help with bathing/dressing/bathroom;Assistance with feeding;Help with stairs or ramp for entrance;Assist for transportation;Assistance with cooking/housework         Precautions / Restrictions Precautions Precautions: Cervical;Fall Precaution Booklet Issued: No Precaution Comments: Pt reports that MD this morning told him he didn't need to wear his cervical collar any more. (no note in pt's chart at time of therapy  session.) Required Braces or Orthoses: Cervical Brace Cervical Brace: Hard collar;Other (comment) (patient reports the MD took it off this morning, stating he did not need to wear it all the time anymore.) Restrictions Weight Bearing Restrictions: No       Mobility Bed Mobility Overal bed mobility: Modified Independent    Patient Response: Cooperative  Transfers Overall transfer level: Needs assistance Equipment used: None Transfers: Sit to/from Stand, Bed to chair/wheelchair/BSC Sit to Stand: Min guard     Step pivot transfers: Min guard     General transfer comment: Pt completed functional mobility from bed to bathroom then to recliner while managing IV pole.     Balance Overall balance assessment: Mild deficits observed, not formally tested            ADL either performed or assessed with clinical judgement   ADL       Grooming: Oral care;Standing;Min guard Grooming Details (indicate cue type and reason): standing at sink          Cognition Arousal/Alertness: Awake/alert Behavior During Therapy: WFL for tasks assessed/performed Overall Cognitive Status: Within Functional Limits for tasks assessed              General Comments: Did not remember cervical precautions during session unless provided reminders.        Exercises Other Exercises Other Exercises: yellow theraputty: 1' each exercise. completed with each hand. grasp/release, lateral pinch, 3 point pinch       General Comments Session completed with cervical collar off per pt's request.    Pertinent Vitals/ Pain       Pain Assessment Pain Assessment: Faces Faces Pain Scale: No hurt         Frequency  Min 2X/week        Progress Toward Goals  OT  Goals(current goals can now be found in the care plan section)  Progress towards OT goals: Progressing toward goals     Plan Discharge plan remains appropriate;Frequency remains appropriate       AM-PAC OT "6 Clicks" Daily  Activity     Outcome Measure   Help from another person eating meals?: A Little Help from another person taking care of personal grooming?: A Little Help from another person toileting, which includes using toliet, bedpan, or urinal?: A Lot Help from another person bathing (including washing, rinsing, drying)?: A Lot Help from another person to put on and taking off regular upper body clothing?: A Lot Help from another person to put on and taking off regular lower body clothing?: A Lot 6 Click Score: 14    End of Session Equipment Utilized During Treatment: Gait belt  OT Visit Diagnosis: Unsteadiness on feet (R26.81);Muscle weakness (generalized) (M62.81);History of falling (Z91.81);Other symptoms and signs involving the nervous system (R29.898)   Activity Tolerance Patient tolerated treatment well   Patient Left in bed;with call bell/phone within reach;with chair alarm set           Time: 1610-9604 OT Time Calculation (min): 27 min  Charges: OT General Charges $OT Visit: 1 Visit OT Treatments $Self Care/Home Management : 8-22 mins $Neuromuscular Re-education: 8-22 mins  [name], OTR/L,CBIS  Supplemental OT - [facility] and [facility] Secure Chat Preferred    [name], [name] March 09/30/2022, 2:29 PM

## 2022-09-30 NOTE — Progress Notes (Signed)
Subjective: No new complaints   Antibiotics:  Anti-infectives (From admission, onward)    Start     Dose/Rate Route Frequency Ordered Stop   09/30/22 1200  penicillin G potassium 12 Million Units in dextrose 5 % 500 mL CONTINUOUS infusion        12 Million Units 41.7 mL/hr over 12 Hours Intravenous Every 12 hours 09/30/22 1113     09/27/22 0800  vancomycin (VANCOREADY) IVPB 750 mg/150 mL  Status:  Discontinued        750 mg 150 mL/hr over 60 Minutes Intravenous Every 12 hours 09/26/22 1546 09/27/22 1541   09/26/22 1515  vancomycin (VANCOREADY) IVPB 1500 mg/300 mL        1,500 mg 150 mL/hr over 120 Minutes Intravenous  Once 09/26/22 1506 09/26/22 1825   09/26/22 1500  cefTRIAXone (ROCEPHIN) 2 g in sodium chloride 0.9 % 100 mL IVPB  Status:  Discontinued        2 g 200 mL/hr over 30 Minutes Intravenous Every 24 hours 09/26/22 1451 09/30/22 1113       Medications: Scheduled Meds:  celecoxib  200 mg Oral Q12H   enoxaparin (LOVENOX) injection  40 mg Subcutaneous Q24H   senna-docusate  1 tablet Oral QHS   Continuous Infusions:  lactated ringers 10 mL/hr at 09/26/22 1231   methocarbamol (ROBAXIN) IV 500 mg (09/28/22 0106)   penicillin G potassium 12 Million Units in dextrose 5 % 500 mL CONTINUOUS infusion 12 Million Units (09/30/22 1208)   PRN Meds:.acetaminophen **OR** acetaminophen, methocarbamol (ROBAXIN) IV, ondansetron **OR** ondansetron (ZOFRAN) IV, oxyCODONE, oxyCODONE, polyethylene glycol, sodium chloride flush    Objective: Weight change:   Intake/Output Summary (Last 24 hours) at 09/30/2022 1438 Last data filed at 09/30/2022 0000 Gross per 24 hour  Intake 400 ml  Output 475 ml  Net -75 ml   Blood pressure 123/78, pulse 79, temperature 98 F (36.7 C), temperature source Oral, resp. rate 18, SpO2 100 %. Temp:  [97.7 F (36.5 C)-98.2 F (36.8 C)] 98 F (36.7 C) (05/06 1302) Pulse Rate:  [77-98] 79 (05/06 1302) Resp:  [17-18] 18 (05/06 1302) BP:  (122-145)/(74-90) 123/78 (05/06 1302) SpO2:  [95 %-100 %] 100 % (05/06 1302)  Physical Exam: Physical Exam Constitutional:      Appearance: He is underweight.  HENT:     Head: Normocephalic and atraumatic.  Eyes:     Conjunctiva/sclera: Conjunctivae normal.  Cardiovascular:     Rate and Rhythm: Normal rate and regular rhythm.  Pulmonary:     Effort: Pulmonary effort is normal. No respiratory distress.     Breath sounds: No wheezing.  Abdominal:     General: There is no distension.     Palpations: Abdomen is soft.  Musculoskeletal:        General: Normal range of motion.     Cervical back: Normal range of motion and neck supple.  Skin:    General: Skin is warm and dry.     Findings: No erythema or rash.  Neurological:     General: No focal deficit present.     Mental Status: He is alert and oriented to person, place, and time.  Psychiatric:        Mood and Affect: Mood normal.        Behavior: Behavior normal.        Thought Content: Thought content normal.        Judgment: Judgment normal.      CBC:  BMET Recent Labs    09/28/22 0229 09/29/22 0205  NA 134* 134*  K 4.1 3.9  CL 107 104  CO2 22 24  GLUCOSE 108* 112*  BUN 16 11  CREATININE 0.90 0.77  CALCIUM 8.1* 8.5*     Liver Panel  No results for input(s): "PROT", "ALBUMIN", "AST", "ALT", "ALKPHOS", "BILITOT", "BILIDIR", "IBILI" in the last 72 hours.     Sedimentation Rate No results for input(s): "ESRSEDRATE" in the last 72 hours. C-Reactive Protein No results for input(s): "CRP" in the last 72 hours.  Micro Results: Recent Results (from the past 720 hour(s))  Culture, blood (routine x 2)     Status: None (Preliminary result)   Collection Time: 09/26/22  6:01 AM   Specimen: BLOOD  Result Value Ref Range Status   Specimen Description BLOOD BLOOD RIGHT HAND  Final   Special Requests   Final    BOTTLES DRAWN AEROBIC AND ANAEROBIC Blood Culture adequate volume   Culture   Final    NO GROWTH  4 DAYS Performed at [facility], [address], [place], [place] [address]    Report Status PENDING  Incomplete  Culture, blood (routine x 2)     Status: None (Preliminary result)   Collection Time: 09/26/22  6:13 AM   Specimen: BLOOD RIGHT ARM  Result Value Ref Range Status   Specimen Description BLOOD RIGHT ARM  Final   Special Requests   Final    BOTTLES DRAWN AEROBIC AND ANAEROBIC Blood Culture adequate volume   Culture   Final    NO GROWTH 4 DAYS Performed at [facility], [address] [place], [place] [address]    Report Status PENDING  Incomplete  Surgical pcr screen     Status: None   Collection Time: 09/26/22 12:24 PM   Specimen: Nasal Mucosa; Nasal Swab  Result Value Ref Range Status   MRSA, PCR NEGATIVE NEGATIVE Final   Staphylococcus aureus NEGATIVE NEGATIVE Final    Comment: (NOTE) The Xpert SA Assay (FDA approved for NASAL specimens in patients 46 years of age and older), is one component of a comprehensive surveillance program. It is not intended to diagnose infection nor to guide or monitor treatment. Performed at [facility], [address], [place], [place] [address]   Aerobic/Anaerobic Culture w Gram Stain (surgical/deep wound)     Status: None (Preliminary result)   Collection Time: 09/26/22  1:44 PM   Specimen: Soft Tissue, Other  Result Value Ref Range Status   Specimen Description TISSUE  Final   Special Requests SWAB OF PREVERTEBRAL TISSUE NO 1  Final   Gram Stain   Final    NO WBC SEEN NO ORGANISMS SEEN Performed at [facility], [address], [place], [place] [address]    Culture   Final    RARE STREPTOCOCCUS PNEUMONIAE SUSCEPTIBILITIES PERFORMED ON PREVIOUS CULTURE WITHIN THE LAST 5 DAYS. NO ANAEROBES ISOLATED; CULTURE IN PROGRESS FOR 5 DAYS    Report Status PENDING  Incomplete  Aerobic/Anaerobic Culture w Gram Stain (surgical/deep wound)     Status: None (Preliminary result)   Collection Time: 09/26/22  1:58  PM   Specimen: Soft Tissue, Other  Result Value Ref Range Status   Specimen Description TISSUE  Final   Special Requests SWAB OF PREVERTEBRAL SOFFT TISSUE NO 2  Final   Gram Stain   Final    NO WBC SEEN NO ORGANISMS SEEN Performed at [facility],  [address] [place], [place] [address]    Culture   Final    RARE STREPTOCOCCUS PNEUMONIAE NO ANAEROBES ISOLATED; CULTURE IN PROGRESS FOR 5 DAYS    Report Status PENDING  Incomplete   Organism ID, Bacteria STREPTOCOCCUS PNEUMONIAE  Final      Susceptibility   Streptococcus pneumoniae - MIC*    ERYTHROMYCIN <=0.12 SENSITIVE Sensitive     LEVOFLOXACIN 0.5 SENSITIVE Sensitive     VANCOMYCIN 0.25 SENSITIVE Sensitive     PENICILLIN (meningitis) <=0.06 SENSITIVE Sensitive     PENO - penicillin <=0.06      PENICILLIN (non-meningitis) <=0.06 SENSITIVE Sensitive     PENICILLIN (oral) <=0.06 SENSITIVE Sensitive     CEFTRIAXONE (non-meningitis) <=0.12 SENSITIVE Sensitive     CEFTRIAXONE (meningitis) <=0.12 SENSITIVE Sensitive     * RARE STREPTOCOCCUS PNEUMONIAE    Studies/Results: No results found.    Assessment/Plan:  INTERVAL HISTORY: pt going to inpatient rehab   Principal Problem:   Discitis of cervical region Active Problems:   Substance use disorder   Cervical discitis    Larry Lester is a 52 y.o. 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  Streptococcus pneumonia has been found to be sensitive to penicillin  #1 Pneumococcal discitis vertebral osteomyelitis:  We can switch him over to high-dose penicillin since the organism is sensitive to penicillin  Not clear how he seeded this site  He has had a tooth that is bothering him and needs to be removed but step pneumo is not typically a pathogen in dental infections but rather due to sino-pulmonary ones  I will check panorex out of thoroughness  I have personally spent 52 minutes involved in face-to-face and non-face-to-face  activities for this patient on the day of the visit. Professional time spent includes the following activities: Preparing to see the patient (review of tests), Obtaining and/or reviewing separately obtained history (admission/discharge record), Performing a medically appropriate examination and/or evaluation , Ordering medications/tests/procedures, referring and communicating with other health care professionals, Documenting clinical information in the EMR, Independently interpreting results (not separately reported), Communicating results to the patient/family/caregiver, Counseling and educating the patient/family/caregiver and Care coordination (not separately reported).     LOS: 4 days   Acey Lav 09/30/2022, 2:38 PM

## 2022-10-01 ENCOUNTER — Other Ambulatory Visit: Payer: Self-pay

## 2022-10-01 DIAGNOSIS — M4642 Discitis, unspecified, cervical region: Secondary | ICD-10-CM | POA: Diagnosis not present

## 2022-10-01 DIAGNOSIS — K0889 Other specified disorders of teeth and supporting structures: Secondary | ICD-10-CM | POA: Diagnosis not present

## 2022-10-01 DIAGNOSIS — M4622 Osteomyelitis of vertebra, cervical region: Secondary | ICD-10-CM | POA: Diagnosis not present

## 2022-10-01 DIAGNOSIS — F1490 Cocaine use, unspecified, uncomplicated: Secondary | ICD-10-CM | POA: Diagnosis not present

## 2022-10-01 LAB — BASIC METABOLIC PANEL
Anion gap: 6 (ref 5–15)
BUN: 19 mg/dL (ref 6–20)
CO2: 24 mmol/L (ref 22–32)
Calcium: 9.1 mg/dL (ref 8.9–10.3)
Chloride: 101 mmol/L (ref 98–111)
Creatinine, Ser: 0.9 mg/dL (ref 0.61–1.24)
GFR, Estimated: >60 mL/min (ref >60)
Glucose, Bld: 108 mg/dL — ABNORMAL HIGH (ref 70–99)
Potassium: 4.2 mmol/L (ref 3.5–5.1)
Sodium: 131 mmol/L — ABNORMAL LOW (ref 135–145)

## 2022-10-01 LAB — CBC
HCT: 35.5 % — ABNORMAL LOW (ref 39.0–52.0)
Hemoglobin: 12.5 g/dL — ABNORMAL LOW (ref 13.0–17.0)
MCH: 34.5 pg — ABNORMAL HIGH (ref 26.0–34.0)
MCHC: 35.2 g/dL (ref 30.0–36.0)
MCV: 98.1 fL (ref 80.0–100.0)
Platelets: 369 10*3/uL (ref 150–400)
RBC: 3.62 MIL/uL — ABNORMAL LOW (ref 4.22–5.81)
RDW: 12.5 % (ref 11.5–15.5)
WBC: 5.5 10*3/uL (ref 4.0–10.5)
nRBC: 0 % (ref 0.0–0.2)

## 2022-10-01 LAB — AEROBIC/ANAEROBIC CULTURE W GRAM STAIN (SURGICAL/DEEP WOUND): Gram Stain: NONE SEEN

## 2022-10-01 LAB — CULTURE, BLOOD (ROUTINE X 2)

## 2022-10-01 MED ORDER — OXYCODONE HCL 5 MG PO TABS
5.0000 mg | ORAL_TABLET | Freq: Four times a day (QID) | ORAL | Status: DC | PRN
  Administered 2022-10-01 – 2022-10-02 (×3): 5 mg via ORAL
  Filled 2022-10-01 (×3): qty 1

## 2022-10-01 NOTE — Progress Notes (Signed)
Subjective: He complained of some cramping in his feet that improved when he got up and sat in a chair Antibiotics:  Anti-infectives (From admission, onward)    Start     Dose/Rate Route Frequency Ordered Stop   09/30/22 1200  penicillin G potassium 12 Million Units in dextrose 5 % 500 mL CONTINUOUS infusion        12 Million Units 41.7 mL/hr over 12 Hours Intravenous Every 12 hours 09/30/22 1113     09/27/22 0800  vancomycin (VANCOREADY) IVPB 750 mg/150 mL  Status:  Discontinued        750 mg 150 mL/hr over 60 Minutes Intravenous Every 12 hours 09/26/22 1546 09/27/22 1541   09/26/22 1515  vancomycin (VANCOREADY) IVPB 1500 mg/300 mL        1,500 mg 150 mL/hr over 120 Minutes Intravenous  Once 09/26/22 1506 09/26/22 1825   09/26/22 1500  cefTRIAXone (ROCEPHIN) 2 g in sodium chloride 0.9 % 100 mL IVPB  Status:  Discontinued        2 g 200 mL/hr over 30 Minutes Intravenous Every 24 hours 09/26/22 1451 09/30/22 1113       Medications: Scheduled Meds:  celecoxib  200 mg Oral Q12H   enoxaparin (LOVENOX) injection  40 mg Subcutaneous Q24H   senna-docusate  1 tablet Oral QHS   Continuous Infusions:  lactated ringers 10 mL/hr at 09/26/22 1231   methocarbamol (ROBAXIN) IV 500 mg (09/28/22 0106)   penicillin G potassium 12 Million Units in dextrose 5 % 500 mL CONTINUOUS infusion 12 Million Units (10/01/22 0154)   PRN Meds:.acetaminophen **OR** acetaminophen, methocarbamol (ROBAXIN) IV, ondansetron **OR** ondansetron (ZOFRAN) IV, oxyCODONE, polyethylene glycol, sodium chloride flush    Objective: Weight change:   Intake/Output Summary (Last 24 hours) at 10/01/2022 1047 Last data filed at 09/30/2022 2000 Gross per 24 hour  Intake --  Output 800 ml  Net -800 ml    Blood pressure 119/85, pulse 94, temperature 98.2 F (36.8 C), temperature source Oral, resp. rate 17, SpO2 100 %. Temp:  [97.7 F (36.5 C)-98.2 F (36.8 C)] 98.2 F (36.8 C) (05/07 0809) Pulse Rate:   [79-94] 94 (05/07 0809) Resp:  [17-20] 17 (05/07 0809) BP: (117-123)/(75-85) 119/85 (05/07 0809) SpO2:  [98 %-100 %] 100 % (05/07 0809)  Physical Exam: Physical Exam Constitutional:      Appearance: He is well-developed.  HENT:     Head: Normocephalic and atraumatic.  Eyes:     Conjunctiva/sclera: Conjunctivae normal.  Cardiovascular:     Rate and Rhythm: Normal rate and regular rhythm.  Pulmonary:     Effort: Pulmonary effort is normal. No respiratory distress.     Breath sounds: No wheezing.  Abdominal:     General: There is no distension.     Palpations: Abdomen is soft.  Musculoskeletal:        General: Normal range of motion.     Cervical back: Normal range of motion and neck supple.  Skin:    General: Skin is warm and dry.     Findings: No erythema or rash.  Neurological:     General: No focal deficit present.     Mental Status: He is alert and oriented to person, place, and time.  Psychiatric:        Mood and Affect: Mood normal.        Behavior: Behavior normal.        Thought Content: Thought content normal.  Judgment: Judgment normal.      CBC:    BMET Recent Labs    09/29/22 0205 10/01/22 0241  NA 134* 131*  K 3.9 4.2  CL 104 101  CO2 24 24  GLUCOSE 112* 108*  BUN 11 19  CREATININE 0.77 0.90  CALCIUM 8.5* 9.1      Liver Panel  No results for input(s): "PROT", "ALBUMIN", "AST", "ALT", "ALKPHOS", "BILITOT", "BILIDIR", "IBILI" in the last 72 hours.     Sedimentation Rate No results for input(s): "ESRSEDRATE" in the last 72 hours. C-Reactive Protein No results for input(s): "CRP" in the last 72 hours.  Micro Results: Recent Results (from the past 720 hour(s))  Culture, blood (routine x 2)     Status: None   Collection Time: 09/26/22  6:01 AM   Specimen: BLOOD  Result Value Ref Range Status   Specimen Description BLOOD BLOOD RIGHT HAND  Final   Special Requests   Final    BOTTLES DRAWN AEROBIC AND ANAEROBIC Blood Culture  adequate volume   Culture   Final    NO GROWTH 5 DAYS Performed at [facility], [address], [place], [place] [address]    Report Status 10/01/2022 FINAL  Final  Culture, blood (routine x 2)     Status: None   Collection Time: 09/26/22  6:13 AM   Specimen: BLOOD RIGHT ARM  Result Value Ref Range Status   Specimen Description BLOOD RIGHT ARM  Final   Special Requests   Final    BOTTLES DRAWN AEROBIC AND ANAEROBIC Blood Culture adequate volume   Culture   Final    NO GROWTH 5 DAYS Performed at [facility], [address] [place], [place] [address]    Report Status 10/01/2022 FINAL  Final  Surgical pcr screen     Status: None   Collection Time: 09/26/22 12:24 PM   Specimen: Nasal Mucosa; Nasal Swab  Result Value Ref Range Status   MRSA, PCR NEGATIVE NEGATIVE Final   Staphylococcus aureus NEGATIVE NEGATIVE Final    Comment: (NOTE) The Xpert SA Assay (FDA approved for NASAL specimens in patients 28 years of age and older), is one component of a comprehensive surveillance program. It is not intended to diagnose infection nor to guide or monitor treatment. Performed at [facility], [address] [place], [place] [address]   Aerobic/Anaerobic Culture w Gram Stain (surgical/deep wound)     Status: None (Preliminary result)   Collection Time: 09/26/22  1:44 PM   Specimen: Soft Tissue, Other  Result Value Ref Range Status   Specimen Description TISSUE  Final   Special Requests SWAB OF PREVERTEBRAL TISSUE NO 1  Final   Gram Stain   Final    NO WBC SEEN NO ORGANISMS SEEN Performed at [facility], [address], [place], [place] [address]    Culture   Final    RARE STREPTOCOCCUS PNEUMONIAE SUSCEPTIBILITIES PERFORMED ON PREVIOUS CULTURE WITHIN THE LAST 5 DAYS. NO ANAEROBES ISOLATED; CULTURE IN PROGRESS FOR 5 DAYS    Report Status PENDING  Incomplete  Aerobic/Anaerobic Culture w Gram Stain (surgical/deep wound)     Status: None (Preliminary result)    Collection Time: 09/26/22  1:58 PM   Specimen: Soft Tissue, Other  Result Value Ref Range Status   Specimen Description TISSUE  Final   Special Requests SWAB OF PREVERTEBRAL SOFFT TISSUE NO 2  Final   Gram Stain   Final    NO  WBC SEEN NO ORGANISMS SEEN Performed at [facility], [address], [place], [place] [address]    Culture   Final    RARE STREPTOCOCCUS PNEUMONIAE NO ANAEROBES ISOLATED; CULTURE IN PROGRESS FOR 5 DAYS    Report Status PENDING  Incomplete   Organism ID, Bacteria STREPTOCOCCUS PNEUMONIAE  Final      Susceptibility   Streptococcus pneumoniae - MIC*    ERYTHROMYCIN <=0.12 SENSITIVE Sensitive     LEVOFLOXACIN 0.5 SENSITIVE Sensitive     VANCOMYCIN 0.25 SENSITIVE Sensitive     PENICILLIN (meningitis) <=0.06 SENSITIVE Sensitive     PENO - penicillin <=0.06      PENICILLIN (non-meningitis) <=0.06 SENSITIVE Sensitive     PENICILLIN (oral) <=0.06 SENSITIVE Sensitive     CEFTRIAXONE (non-meningitis) <=0.12 SENSITIVE Sensitive     CEFTRIAXONE (meningitis) <=0.12 SENSITIVE Sensitive     * RARE STREPTOCOCCUS PNEUMONIAE    Studies/Results: [place] EKG SITE RITE  Result Date: 10/01/2022 If Site Rite image not attached, placement could not be confirmed due to current cardiac rhythm.  DG Orthopantogram  Result Date: 09/30/2022 CLINICAL DATA:  Dental infection EXAM: ORTHOPANTOGRAM/PANORAMIC one-view COMPARISON:  None Available. FINDINGS: There are focal areas of lucency are cavities identified involving the most posterior molar along the right side of the maxilla, tooth number 2. Absent third molar along both sides of the maxilla. There is also a cavity along the lateral margin of the right lateral incisor, tooth 7. Along the mandible there is also cavities seen abutting each other of 2 adjacent teeth, teeth number 29 and 30. There is absence of the crown of the tooth along the most posterior molar along left side of the mandible, tooth number 17. Of note the wisdom tooth  on the right mandible is horizontal, rotated clockwise 90 degrees IMPRESSION: Multiple dental caries. Impacted and rotated right mandibular third molar Electronically Signed   By: Karen Kays M.D.   On: 09/30/2022 16:31      Assessment/Plan:  INTERVAL HISTORY: Panorex without dental infection  Principal Problem:   Discitis of cervical region Active Problems:   Substance use disorder   Cervical discitis   Tooth pain    Larry Lester is a 52 y.o. 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  Streptococcus pneumonia has been found to be sensitive to penicillin  #1 Pneumococcal discitis vertebral osteomyelitis:  Will continue penicillin to complete 6 weeks of therapy.  #2 Dental problems no infection here  I have personally spent 50 minutes involved in face-to-face and non-face-to-face activities for this patient on the day of the visit. Professional time spent includes the following activities: Preparing to see the patient (review of tests), Obtaining and/or reviewing separately obtained history (admission/discharge record), Performing a medically appropriate examination and/or evaluation , Ordering medications/tests/procedures, referring and communicating with other health care professionals, Documenting clinical information in the EMR, Independently interpreting results (not separately reported), Communicating results to the patient/family/caregiver, Counseling and educating the patient/family/caregiver and Care coordination (not separately reported).    I will sign off for now.  Please call us back as he nears DC from inpatient rehab.   LOS: 5 days   Acey Lav 10/01/2022, 10:47 AM

## 2022-10-01 NOTE — Progress Notes (Addendum)
  Inpatient Rehabilitation Admissions Coordinator   Patient has decided that yes, he is functionally ready to d/c home. He is ambulating well and no longer in need of CIR admit. I have alerted acute team and TOC. We will sign off.  Ottie Glazier, RN, MSN Rehab Admissions Coordinator [phone] 10/01/2022 12:46 PM  I spoke to patient's brother by phone to explain current [facility] recommendation, not CIR admit.   Ottie Glazier, RN, MSN Rehab Admissions Coordinator [phone] 10/01/2022 1:05 PM

## 2022-10-01 NOTE — Progress Notes (Signed)
      INFECTIOUS DISEASE ATTENDING ADDENDUM:   Date: 10/01/2022  Patient name: Larry Lester  Medical record number: [id number]  Date of birth: 04-20-1971   Patient is not going to inpatient rehab  His home life does not seem conducive to Home IV abx  He is not bacteremic  I think we can successfully treat him with high dose amoxicillin 1 gram po TID and send him out with 42 days worth of meds   [name] has an appointment on 10/23/2022 at 215PM with Dr. Daiva Eves at  [facility], which  is located in the [facility] at  [address] in [place].  [address], which is located to the left of the elevators.  Phone: [phone]  Fax: [phone]  [email or web address]  The patient should arrive 30 minutes prior to their appoitment.     [name] 10/01/2022, 1:20 PM

## 2022-10-01 NOTE — Progress Notes (Signed)
Inpatient Rehabilitation Admissions Coordinator   I met at bedside with patient and his girlfriend. We reviewed cost of rehab as well as goals and expectations. Patient to have PICC line in and arrangements for long term antibiotics. He states he was about to walk in the Hallways with his girlfriend. I discussed that if he is able to ambulate with Marylene Land in the hallways, why did he need intensive rehab prior to discharge home. He would like to discuss with his brother before determining if he needs 5 to 7 days of CIR  vs direct discharge home. He lives at [facility]" and HH likely difficult to arrange for IV antibiotics. Likely would need patient education for IV antibiotics prior to discharge home due to area where he lives per patient. I will follow up by 130 today with patient choice of CIR vs direct d/c home.  Ottie Glazier, RN, MSN Rehab Admissions Coordinator [phone] 10/01/2022 10:50 AM

## 2022-10-01 NOTE — Progress Notes (Signed)
Physical Therapy Treatment Patient Details Name: Larry Lester MRN: [id number] DOB: 05-15-1971 Today's Date: 10/01/2022   History of Present Illness Pt is a 52 y/o M admitted on 09/26/22 after presenting with c/o neck & lower back pain after falling multiple times/day. 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 Pt s/p anterior neck exploration5/2/24. PMH: substance use disorder    PT Comments    Pt making good progress.  He is ambulating 300' with supervision level and has been ambulating with family.  Did try without RW but pt with mild instability, slower gait, and increased pain - continue to recommend RW use.  Did update recommendations to outpt PT level as no longer has need for AIR level therapy but pt does still have decreased strength throughout and decreased balance that would benefit from further PT.  Pt had phone call at end of session -needing to end session.    Recommendations for follow up therapy are one component of a multi-disciplinary discharge planning process, led by the attending physician.  Recommendations may be updated based on patient status, additional functional criteria and insurance authorization.  Follow Up Recommendations       Assistance Recommended at Discharge Intermittent Supervision/Assistance  Patient can return home with the following A little help with walking and/or transfers;A little help with bathing/dressing/bathroom;Assistance with cooking/housework;Assist for transportation;Help with stairs or ramp for entrance   Equipment Recommendations  Rolling walker (2 wheels)    Recommendations for Other Services       Precautions / Restrictions Precautions Precautions: Cervical;Fall Restrictions Other Position/Activity Restrictions: Per PT secure chat Dr. Benito Mccreedy on 10/01/22 no longer needs cervical brace     Mobility  Bed Mobility Overal bed mobility: Modified Independent Bed Mobility: Rolling, Sidelying to Sit,  Sit to Sidelying Rolling: Modified independent (Device/Increase time) Sidelying to sit: Modified independent (Device/Increase time), HOB elevated     Sit to sidelying: Modified independent (Device/Increase time)      Transfers Overall transfer level: Needs assistance Equipment used: None Transfers: Sit to/from Stand Sit to Stand: Supervision           General transfer comment: Demonstrated safely with and without RW    Ambulation/Gait Ambulation/Gait assistance: Supervision Gait Distance (Feet): 300 Feet Assistive device: Rolling walker (2 wheels), None Gait Pattern/deviations: Step-through pattern Gait velocity: decreased     General Gait Details: Started 6' without RW but pt with small steps, increased pain, and mild unsteadiness requiring min guard.  Switched to RW and improved step length, endurance, stability, and pain control.  Pt ambulated 260' with RW   Stairs Stairs: Yes Stairs assistance: Min guard Stair Management: One rail Right, Forwards, Step to pattern Number of Stairs: 12 General stair comments: Up 6 steps with R rail and step to pattern; pt also did 6 steps with R rail and RW in L hand used for stability. Both with min guard.   Wheelchair Mobility    Modified Rankin (Stroke Patients Only)       Balance Overall balance assessment: Mild deficits observed, not formally tested Sitting-balance support: Feet supported Sitting balance-Leahy Scale: Good     Standing balance support: Bilateral upper extremity supported, No upper extremity supported Standing balance-Leahy Scale: Fair Standing balance comment: Pt could take some steps without RW but unsteady - improved balance with RW                            Cognition Arousal/Alertness: Awake/alert Behavior  During Therapy: WFL for tasks assessed/performed Overall Cognitive Status: Within Functional Limits for tasks assessed                                 General  Comments: Recalled precautions; demonstrated safely        Exercises      General Comments        Pertinent Vitals/Pain Pain Assessment Pain Assessment: 0-10 Pain Score: 3  Pain Location: Lower Back & BLE's Pain Descriptors / Indicators: Tightness, Tingling Pain Intervention(s): Monitored during session, Repositioned    Home Living                          Prior Function            PT Goals (current goals can now be found in the care plan section) Progress towards PT goals: Progressing toward goals    Frequency    Min 3X/week      PT Plan Frequency needs to be updated;Discharge plan needs to be updated    Co-evaluation              AM-PAC PT "6 Clicks" Mobility   Outcome Measure  Help needed turning from your back to your side while in a flat bed without using bedrails?: None Help needed moving from lying on your back to sitting on the side of a flat bed without using bedrails?: None Help needed moving to and from a bed to a chair (including a wheelchair)?: A Little Help needed standing up from a chair using your arms (e.g., wheelchair or bedside chair)?: A Little Help needed to walk in hospital room?: A Little Help needed climbing 3-5 steps with a railing? : A Little 6 Click Score: 20    End of Session Equipment Utilized During Treatment: Gait belt Activity Tolerance: Patient tolerated treatment well Patient left: in bed;with call bell/phone within reach Nurse Communication: Mobility status PT Visit Diagnosis: Muscle weakness (generalized) (M62.81);Unsteadiness on feet (R26.81);Other abnormalities of gait and mobility (R26.89)     Time: 4540-9811 PT Time Calculation (min) (ACUTE ONLY): 13 min  Charges:  $Gait Training: 8-22 mins                     Anise Salvo, PT Acute Rehab [facility] [phone]    Rayetta Humphrey 10/01/2022, 3:34 PM

## 2022-10-01 NOTE — TOC Initial Note (Signed)
Transition of Care [facility]) - Initial/Assessment Note    Patient Details  Name: Larry Lester MRN: [id number] Date of Birth: 1970/10/26  Transition of [facility]) CM/SW Contact:    Kingsley Plan, RN Phone Number: 10/01/2022, 2:26 PM  Clinical Narrative:                  Received secure chat from team that patient will be discharging to home 10/02/22 . MD would like OP PT/OT and walker. PAtient does not have a PCP DR Benito Mccreedy will follow at clinic and will place appointment information on AVS.   Discussed with patient. Patient in agreement would like OP PT/OT at [address]. Order placed secure chatted MD to sign. Information placed on AVS. Confirmed patient's contact information.   Per Dr Benito Mccreedy patient does NOT need IV ABX at home.   Ordered walker with Lelon Mast with [employer]  Expected Discharge Plan: Home/Self Care Barriers to Discharge: Continued Medical Work up   Patient Goals and CMS Choice Patient states their goals for this hospitalization and ongoing recovery are:: to return to home CMS Medicare.gov Compare Post Acute Care list provided to:: Patient Choice offered to / list presented to : Patient [place] ownership interest in [facility].provided to:: Patient    Expected Discharge Plan and Services   Discharge Planning Services: CM Consult Post Acute Care Choice: Durable Medical Equipment Living arrangements for the past 2 months: Single Family Home                 DME Arranged: Walker rolling DME Agency: [employer] Date DME Agency Contacted: 10/01/22 Time DME Agency Contacted: 1426 Representative spoke with at DME Agency: Vaughan Basta HH Arranged: NA          Prior Living Arrangements/Services Living arrangements for the past 2 months: Single Family Home Lives with:: Significant Other Patient language and need for interpreter reviewed:: Yes Do you feel safe going back to the place where you live?: Yes      Need for Family Participation  in Patient Care: Yes (Comment) Care giver support system in place?: Yes (comment)   Criminal Activity/Legal Involvement Pertinent to Current Situation/Hospitalization: No - Comment as needed  Activities of Daily Living Home Assistive Devices/Equipment: None ADL Screening (condition at time of admission) Patient's cognitive ability adequate to safely complete daily activities?: Yes Is the patient deaf or have difficulty hearing?: No Does the patient have difficulty seeing, even when wearing glasses/contacts?: No Does the patient have difficulty concentrating, remembering, or making decisions?: No Patient able to express need for assistance with ADLs?: Yes Does the patient have difficulty dressing or bathing?: Yes Independently performs ADLs?: No Communication: Independent Is this a change from baseline?: Pre-admission baseline Dressing (OT): Needs assistance Is this a change from baseline?: Change from baseline, expected to last >3 days Feeding: Independent Bathing: Needs assistance Is this a change from baseline?: Change from baseline, expected to last >3 days Toileting: Needs assistance Is this a change from baseline?: Change from baseline, expected to last >3days In/Out Bed: Needs assistance Is this a change from baseline?: Change from baseline, expected to last >3 days Walks in Home: Needs assistance Is this a change from baseline?: Change from baseline, expected to last >3 days Does the patient have difficulty walking or climbing stairs?: Yes Weakness of Legs: Both Weakness of Arms/Hands: None  Permission Sought/Granted   Permission granted to share information with : No  Emotional Assessment Appearance:: Appears stated age Attitude/Demeanor/Rapport: Engaged Affect (typically observed): Accepting Orientation: : Oriented to Self, Oriented to Place, Oriented to  Time, Oriented to Situation Alcohol / Substance Use: Not Applicable Psych Involvement: No  (comment)  Admission diagnosis:  Osteomyelitis of cervical spine (HCC) [M46.22] Fall, initial encounter [W19.XXXA] Discitis of cervical region [M46.42] Cervical discitis [M46.42] Patient Active Problem List   Diagnosis Date Noted   Tooth pain 09/30/2022   Discitis of cervical region 09/26/2022   Substance use disorder 09/26/2022   Cervical discitis 09/26/2022   PCP:  Pcp, No Pharmacy:   [id number] - [place], [place] - [address] AT [address] [place] [address] Phone: [phone] Fax: [phone]  [id number] - [place] ([place]), [place] - [address] [place] [address] Phone: [phone] Fax: [phone]     Social Determinants of Health (SDOH) Social History: SDOH Screenings   Food Insecurity: No Food Insecurity (09/26/2022)  Housing: Low Risk  (09/26/2022)  Transportation Needs: No Transportation Needs (09/26/2022)  Utilities: Not At Risk (09/26/2022)  Tobacco Use: Medium Risk (09/27/2022)   SDOH Interventions:     Readmission Risk Interventions     No data to display

## 2022-10-01 NOTE — Progress Notes (Signed)
                 Interval history This person's condition has improved to the point where he no longer needs intensive rehab prior to discharge from the hospital.  He expresses some apprehension about leaving, but otherwise feels well and is without new concerns today.  Physical exam Blood pressure 117/75, pulse 86, temperature 98 F (36.7 C), temperature source Oral, resp. rate 20, SpO2 100 %.  Overall well-appearing Breathing is regular and unlabored on room air Skin is warm and dry Some left cervical paraspinal and left trapezius muscle tenderness Alert and oriented Pleasant, concordant affect  Assessment and plan Hospital day 5  Larry Lester is a 52 y.o. admitted for discitis and osteomyelitis at the C6-C7 region of the cervical spine due to Streptococcus pneumoniae.  Principal Problem:   Discitis of cervical region Active Problems:   Substance use disorder   Cervical discitis   Tooth pain  Cervical discitis and osteomyelitis at C6-C7 Epidural phlegmon Cervical radiculopathy Intraoperative cultures with strep pneumonia.  Remains without signs of systemic infection or sepsis.  Medically stable for discharge from the hospital.  No indication for intensive rehab at this point.  Will continue IV antibiotics through today and switch to orals on discharge tomorrow. - IV penicillin G - Celecoxib 200 mg p.o. every 12 hours   Cocaine use disorder Person reports using inhaled cocaine.  Denies IV drug use. - Continue to encourage abstinence  Transitions of care Now that he is without inpatient rehabilitation needs, will work on arranging resources so that he can leave the hospital and return to his prior living situation safely.  Anticipated need for outpatient PT/OT of some sort and some DME like a rolling walker.  Assistance from [facility], PT/OT is much appreciated.   Diet: Regular IVF: None VTE: enoxaparin (LOVENOX) injection 40 mg Start: 09/30/22 1500 SCD's Start: 09/26/22  1450  Code: Full PT/OT recommendations: intermittent supervision, rolling walker Family Update: Girlfriend at bedside   Discharge plan: To inpatient rehab as soon as bed becomes available   Marrianne Mood MD 10/01/2022, 7:02 AM  Pager: [phone] After 5pm or weekend: [phone]   Marrianne Mood MD 10/01/2022, 7:03 AM  Pager: [phone] After 5pm or weekend: [phone]

## 2022-10-01 NOTE — Progress Notes (Signed)
                 Interval history Neck still bothering him somewhat.  Eager to get the cervical stabilization collar off.  Otherwise no acute concerns.  Physical exam Blood pressure 117/75, pulse 86, temperature 98 F (36.7 C), temperature source Oral, resp. rate 20, SpO2 100 %.  Well-appearing, with cervical collar in place Heart rate is normal rhythm is regular, strong radial pulses Breathing is regular and unlabored on room air, anterior lung fields are clear Abdomen is nontender Skin is warm and dry Some tenderness over the left paraspinal area towards trapezius Alert and oriented Pleasant, jovial, concordant affect  Weight change:    Intake/Output Summary (Last 24 hours) at 10/01/2022 0702 Last data filed at 09/30/2022 2000 Gross per 24 hour  Intake --  Output 800 ml  Net -800 ml    Net IO Since Admission: -479.63 mL [10/01/22 0702]  Labs, images, and other studies White blood cells and hemoglobin stable  Assessment and plan Hospital day 5  Larry Lester is a 52 y.o. admitted for discitis and osteomyelitis of the C6-C7 region of the cervical spine, with cultures growing rare Streptococcus pneumoniae.  Principal Problem:   Discitis of cervical region Active Problems:   Substance use disorder   Cervical discitis   Tooth pain  Cervical discitis and osteomyelitis at C6-C7 Epidural phlegmon Cervical radiculopathy Intraoperative cultures with strep pneumo.  Remains without signs of systemic infection or sepsis.  Will need prolonged course of IV antibiotics.  Appreciate ID assistance with this case.  Medically stable for discharge to acute inpatient rehab. - Continue IV ceftriaxone - Celecoxib 200 mg p.o. every 12 hours  Cocaine use disorder Person reports using inhaled cocaine.  Denies IV drug use. - Continue to encourage abstinence  Diet: Regular IVF: None VTE: enoxaparin (LOVENOX) injection 40 mg Start: 09/30/22 1500 SCD's Start: 09/26/22 1450  Code:  Full PT/OT recommendations: Acute inpatient rehab Family Update: Girlfriend at bedside  Discharge plan: To inpatient rehab as soon as bed becomes available  Marrianne Mood MD 10/01/2022, 7:02 AM  Pager: [phone] After 5pm or weekend: [phone]

## 2022-10-01 NOTE — Progress Notes (Signed)
Mobility Specialist Progress Note   10/01/22 1030  Pain Assessment  Pain Assessment 0-10  Pain Score 5  Pain Location Lower Back & BLE's  Pain Descriptors / Indicators Tightness;Tingling  Pain Intervention(s) Patient requesting pain meds-RN notified  Mobility  Activity Ambulated with assistance in hallway  Level of Assistance Standby assist, set-up cues, supervision of patient - no hands on  Assistive Device None (IV Pole)  Distance Ambulated (ft) 200 ft  Range of Motion/Exercises Active;All extremities  Activity Response Tolerated well   Patient received in supine and agreeable to participate. Completed bed mobility independently and ambulated supervision level with slow gait. Required standing rest break x1 secondary to lower back pain and BLE tingling. Returned to room without complaint or incident. Was left dangling EOB with all needs met, call bell in reach.   Larry Lester, BS EXP Mobility Specialist Please contact via SecureChat or Rehab office at [phone]

## 2022-10-02 ENCOUNTER — Other Ambulatory Visit (HOSPITAL_COMMUNITY): Payer: Self-pay

## 2022-10-02 MED ORDER — IBUPROFEN 800 MG PO TABS: 800.0000 mg | ORAL_TABLET | Freq: Three times a day (TID) | ORAL | Status: DC | PRN

## 2022-10-02 MED ORDER — ACETAMINOPHEN 500 MG PO TABS: 1000.0000 mg | ORAL_TABLET | Freq: Three times a day (TID) | ORAL | Status: DC

## 2022-10-02 MED ORDER — AMOXICILLIN 500 MG PO CAPS
1000.0000 mg | ORAL_CAPSULE | Freq: Three times a day (TID) | ORAL | 0 refills | Status: AC
  Filled 2022-10-02: qty 180, 30d supply, fill #0

## 2022-10-02 MED ORDER — OXYCODONE HCL 5 MG PO TABS
5.0000 mg | ORAL_TABLET | Freq: Four times a day (QID) | ORAL | 0 refills | Status: DC | PRN
  Filled 2022-10-02: qty 12, 3d supply, fill #0

## 2022-10-02 NOTE — Plan of Care (Signed)
AVS reviewed.  All questions answered.

## 2022-10-02 NOTE — Discharge Summary (Signed)
Name: Larry Lester MRN: [id number] DOB: 03/22/1971 52 y.o. PCP: Pcp, No  Date of Admission: 09/26/2022  3:19 AM Date of Discharge: 10/02/2022 3:11 PM Attending Physician: No att. providers found  Discharge Diagnosis: Principal Problem:   Discitis of cervical region Active Problems:   Substance use disorder   Cervical discitis   Tooth pain  Resolved Problems:   * No resolved hospital problems. *   Discharge Medications: Allergies as of 10/02/2022       Reactions   Crab [shellfish Allergy] Anaphylaxis        Medication List     STOP taking these medications    methocarbamol 500 MG tablet Commonly known as: ROBAXIN   potassium chloride SA 20 MEQ tablet Commonly known as: KLOR-CON M   triamcinolone cream 0.1 % Commonly known as: KENALOG       TAKE these medications    acetaminophen 500 MG tablet Commonly known as: TYLENOL Take 2 tablets (1,000 mg total) by mouth every 8 (eight) hours. What changed:  how much to take when to take this reasons to take this   amoxicillin 500 MG capsule Commonly known as: AMOXIL Take 2 capsules (1,000 mg total) by mouth in the morning, at noon, and at bedtime for 42 days.   hydrOXYzine 25 MG tablet Commonly known as: ATARAX Take 1 tablet (25 mg total) by mouth every 6 (six) hours as needed for itching.   ibuprofen 800 MG tablet Commonly known as: ADVIL Take 1 tablet (800 mg total) by mouth every 8 (eight) hours as needed. What changed:  medication strength how much to take when to take this   oxyCODONE 5 MG immediate release tablet Commonly known as: Oxy IR/ROXICODONE Take 1 tablet (5 mg total) by mouth every 6 (six) hours as needed for moderate pain or breakthrough pain ((score 4 to 6)).               Durable Medical Equipment  (From admission, onward)           Start     Ordered   10/01/22 1404  For home use only DME Walker rolling  Once       Question Answer Comment  Walker: With 5 Inch Wheels    Patient needs a walker to treat with the following condition Discitis of cervical region      10/01/22 1404            Follow-up Appointments:  Follow-up Information     [facility] Follow up.   Specialty: Rehabilitation Contact information: [address] [phone] mc [address] [place] [address] [phone]        Quincy Simmonds, MD. Nyra Capes on 10/10/2022.   Specialty: Internal Medicine Why: Please arrive by 1:15 p.m. for your appointment at 1:45 p.m. Contact information: [address] [place] [address] [phone]                 Disposition and follow-up: Mr. Larry Lester is a 52 y.o. year old hospitalized for C6-C7 discitis and osteomyelitis.  Discitis and osteomyelitis of C6-C7 region Due to penicillin sensitive Streptococcus pneumonia.  Negative blood cultures, uncertain source.  Complicated by cervical radiculopathy and upper extremity weakness.  Discharged with outpatient PT/OT and rolling walker.  6 weeks of high-dose amoxicillin. - Amoxicillin 1 g 3 times daily until 11/13/2022 - Follow-up with infectious disease - 3 days of oxycodone 5 mg, then scheduled Tylenol and NSAIDs sparingly for pain  Cocaine use disorder Via the smoked route.  No known history of IV drug use. - Encourage abstinence  Hospital Course by problem list: Discitis and osteomyelitis of C6-C7 region Cervical radiculopathy Came in with back pain and neck pain after fall at home.  MRI showed severe C6-C7 discitis/osteomyelitis with epidural phlegmon and punctate prevertebral abscesses.  There was mild to moderate spinal stenosis with some cord compression.  Lumbar MRI was negative except for some degenerative changes.  Blood cultures obtained prior to initiation of antibiotics were negative.  Uncertain source.  Poor dentition.  No history of IV drug use.  Neurosurgery got cultures through anterior neck exploration  on 09/26/2022.  Those cultures grew rare Streptococcus pneumonia.  He got 6 days of IV antibiotics prior to discharge on high-dose amoxicillin.  He was discharged with outpatient PT/OT and a rolling walker.  Cocaine use disorder Via the inhaled route.  Counseled about cessation.  Very forthcoming.  Denies IV drug use.  Discharge Exam: Asks about pain regimen on discharge. Pain is on his low back. Asks about neck brace. States that he might be jumping around when he's back in his unsafe home environment. Discussed short course of oxy on discharge. Discussed taking tylenol and ibuprofen for pain control. Asks about walker on discharge. Discussed antibiotic course and emphasized importance of taking antibiotic. Discussed follow-up appointments with IM clinic and ID.    Blood pressure 112/76, pulse 87, temperature (!) 97.5 F (36.4 C), temperature source Oral, resp. rate 16, SpO2 100 %.  Well-appearing Heart rate is borderline tachycardic, rhythm is regular, radial pulses are strong Breathing is regular and unlabored on room air Lumbar paraspinal muscle tenderness Skin is warm and dry Alert and oriented Pleasant, concordant affect  Pertinent studies and procedures: No results found for this or any previous visit (from the past 40981 hour(s)). Imaging Orders         CT Cervical Spine Wo Contrast         DG Chest 2 View         CT Head Wo Contrast         MR Cervical Spine W and Wo Contrast         DG Pelvis 1-2 Views         MR Lumbar Spine W Wo Contrast         DG Cervical Spine 1 View         DG Orthopantogram         [place] EKG SITE RITE    Lab Orders         Culture, blood (routine x 2)         Surgical pcr screen         Aerobic/Anaerobic Culture w Gram Stain (surgical/deep wound)         Aerobic/Anaerobic Culture w Gram Stain (surgical/deep wound)         Comprehensive metabolic panel         CBC with Differential         Protime-INR         APTT         HIV Antibody (routine  testing w rflx)         CBC         Basic metabolic panel         Ferritin         Iron and TIBC         Rapid urine drug screen (hospital performed)  CBC         Basic metabolic panel         CBC         Basic metabolic panel         CBC         Basic metabolic panel         CBC     Discharge Instructions:   Discharge Instructions      To Mr. KEYGAN KINGERY or their caretakers,  They were admitted to [facility] on 09/26/2022 for evaluation and treatment of:  Principal Problem:   Discitis of cervical region Active Problems:   Substance use disorder   Cervical discitis   Tooth pain  The evaluation suggested an infection of the spinal bones in the region of the neck. They were treated IV antibiotics, pain medicine, and therapy.  They were discharged from the hospital on 10/02/22. I recommend the following after leaving the hospital:   Most important thing you can do for your health is to quit using cocaine as soon as possible.  Also, it is very important that you take the amoxicillin antibiotic 2 capsules THREE times daily until it's all gone.   Please be sure to attend all of your scheduled doctor appointments after you leave the hospital.  Marrianne Mood MD 10/02/2022, 11:16 AM      Marrianne Mood MD 10/02/2022, 3:11 PM

## 2022-10-02 NOTE — Progress Notes (Signed)
Mobility Specialist Progress Note   10/02/22 1000  Mobility  Activity Ambulated with assistance in hallway  Level of Assistance Standby assist, set-up cues, supervision of patient - no hands on  Assistive Device Front wheel walker  Distance Ambulated (ft) 300 ft  Range of Motion/Exercises Active;All extremities  Activity Response Tolerated well   Patient received in supine and agreeable to participate. Ambulated supervision level with steady gait. Returned to room without incident. Was left dangling EOB with all needs met, call bell in reach.   Larry Lester, BS EXP Mobility Specialist Please contact via SecureChat or Rehab office at [phone]

## 2022-10-02 NOTE — Plan of Care (Signed)
?  Problem: Education: ?Goal: Knowledge of General Education information will improve ?Description: Including pain rating scale, medication(s)/side effects and non-pharmacologic comfort measures ?Outcome: Progressing ?  ?Problem: Health Behavior/Discharge Planning: ?Goal: Ability to manage health-related needs will improve ?Outcome: Progressing ?  ?Problem: Activity: ?Goal: Risk for activity intolerance will decrease ?Outcome: Progressing ?  ?Problem: Nutrition: ?Goal: Adequate nutrition will be maintained ?Outcome: Progressing ?  ?Problem: Skin Integrity: ?Goal: Risk for impaired skin integrity will decrease ?Outcome: Progressing ?  ?

## 2022-10-02 NOTE — TOC Transition Note (Signed)
Transition of Care [facility]) - CM/SW Discharge Note   Patient Details  Name: Larry Lester MRN: [id number] Date of Birth: 11/06/1970  Transition of Care [facility]) CM/SW Contact:  Epifanio Lesches, RN Phone Number: 10/02/2022, 10:46 AM   Clinical Narrative:    Patient will DC to: home Anticipated DC date: 10/02/2022 Family notified: yes Transport by: car       - s/p anterior neck exploration 09/26/22   Per MD patient ready for DC today. RN, patient, and patient's wife  notified of DC.  Wife to assist with care once d/c.  RW will be delivered to bedside prior to d/c. Post hospital f/u noted on AVS.  Pt without RX med concerns. Wife will provide transportation to home. RNCM will sign off for now as intervention is no longer needed. Please consult [place] again if new needs arise.    Final next level of care: Home/Self Care Barriers to Discharge: Continued Medical Work up   Patient Goals and CMS Choice CMS Medicare.gov Compare Post Acute Care list provided to:: Patient Choice offered to / list presented to : Patient  Discharge Placement                         Discharge Plan and Services Additional resources added to the After Visit Summary for     Discharge Planning Services: CM Consult Post Acute Care Choice: Durable Medical Equipment          DME Arranged: Dan Humphreys rolling DME Agency: [employer] Date DME Agency Contacted: 10/01/22 Time DME Agency Contacted: 1426 Representative spoke with at DME Agency: Vaughan Basta HH Arranged: NA          Social Determinants of Health (SDOH) Interventions SDOH Screenings   Food Insecurity: No Food Insecurity (09/26/2022)  Housing: Low Risk  (09/26/2022)  Transportation Needs: No Transportation Needs (09/26/2022)  Utilities: Not At Risk (09/26/2022)  Tobacco Use: Medium Risk (09/27/2022)     Readmission Risk Interventions     No data to display

## 2022-10-10 ENCOUNTER — Encounter: Payer: Self-pay | Admitting: Student

## 2022-10-23 ENCOUNTER — Ambulatory Visit: Payer: Commercial Managed Care - HMO | Admitting: Infectious Disease

## 2022-10-23 NOTE — Progress Notes (Deleted)
   Subjective:    Patient ID: Larry Lester, male    DOB: March 21, 1971, 52 y.o.   MRN: [id number]  HPI  52 y.o. 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  Treat with IV penicillin and then transition over to high-dose amoxicillin 1 g p.o. 3 times daily.      No past medical history on file.  Past Surgical History:  Procedure Laterality Date   ANTERIOR CERVICAL DECOMP/DISCECTOMY FUSION N/A 09/26/2022   Procedure: ANTERIOR NECK EXPLORATION;  Surgeon: Coletta Memos, MD;  Location: [facility] OR;  Service: Neurosurgery;  Laterality: N/A;   NO PAST SURGERIES      No family history on file.    Social History   Socioeconomic History   Marital status: Single    Spouse name: Not on file   Number of children: Not on file   Years of education: Not on file   Highest education level: Not on file  Occupational History   Not on file  Tobacco Use   Smoking status: Former    Types: Cigarettes   Smokeless tobacco: Not on file  Substance and Sexual Activity   Alcohol use: No   Drug use: Not Currently    Types: Marijuana    Comment: crack   Sexual activity: Not on file  Other Topics Concern   Not on file  Social History Narrative   Not on file   Social Determinants of Health   Financial Resource Strain: Not on file  Food Insecurity: No Food Insecurity (09/26/2022)   Hunger Vital Sign    Worried About Running Out of Food in the Last Year: Never true    Ran Out of Food in the Last Year: Never true  Transportation Needs: No Transportation Needs (09/26/2022)   PRAPARE - Administrator, Civil Service (Medical): No    Lack of Transportation (Non-Medical): No  Physical Activity: Not on file  Stress: Not on file  Social Connections: Not on file    Allergies  Allergen Reactions   Crab [Shellfish Allergy] Anaphylaxis     Current Outpatient Medications:    acetaminophen (TYLENOL) 500 MG tablet, Take 2 tablets (1,000 mg total) by mouth every 8 (eight)  hours., Disp: , Rfl:    amoxicillin (AMOXIL) 500 MG capsule, Take 2 capsules (1,000 mg total) by mouth in the morning, at noon, and at bedtime for 42 days., Disp: 252 capsule, Rfl: 0   hydrOXYzine (ATARAX) 25 MG tablet, Take 1 tablet (25 mg total) by mouth every 6 (six) hours as needed for itching., Disp: 12 tablet, Rfl: 0   ibuprofen (ADVIL) 800 MG tablet, Take 1 tablet (800 mg total) by mouth every 8 (eight) hours as needed., Disp: , Rfl:    oxyCODONE (OXY IR/ROXICODONE) 5 MG immediate release tablet, Take 1 tablet (5 mg total) by mouth every 6 (six) hours as needed for moderate pain or breakthrough pain ((score 4 to 6))., Disp: 12 tablet, Rfl: 0    Review of Systems     Objective:   Physical Exam        Assessment & Plan:

## 2022-11-26 ENCOUNTER — Ambulatory Visit: Payer: Self-pay | Admitting: Infectious Disease

## 2023-01-13 ENCOUNTER — Emergency Department (HOSPITAL_COMMUNITY): Payer: Commercial Managed Care - HMO

## 2023-01-13 ENCOUNTER — Other Ambulatory Visit: Payer: Self-pay

## 2023-01-13 ENCOUNTER — Emergency Department (HOSPITAL_COMMUNITY)
Admission: EM | Admit: 2023-01-13 | Discharge: 2023-01-14 | Disposition: A | Discharge status: Home / Self Care | Payer: Commercial Managed Care - HMO | Attending: Emergency Medicine | Admitting: Emergency Medicine

## 2023-01-13 DIAGNOSIS — W1789XA Other fall from one level to another, initial encounter: Secondary | ICD-10-CM | POA: Insufficient documentation

## 2023-01-13 DIAGNOSIS — S065X9A Traumatic subdural hemorrhage with loss of consciousness of unspecified duration, initial encounter: Secondary | ICD-10-CM | POA: Insufficient documentation

## 2023-01-13 DIAGNOSIS — S0091XA Abrasion of unspecified part of head, initial encounter: Secondary | ICD-10-CM | POA: Diagnosis not present

## 2023-01-13 DIAGNOSIS — Z23 Encounter for immunization: Secondary | ICD-10-CM | POA: Insufficient documentation

## 2023-01-13 DIAGNOSIS — S065XAA Traumatic subdural hemorrhage with loss of consciousness status unknown, initial encounter: Secondary | ICD-10-CM

## 2023-01-13 DIAGNOSIS — S0990XA Unspecified injury of head, initial encounter: Secondary | ICD-10-CM | POA: Diagnosis present

## 2023-01-13 MED ORDER — HYDROMORPHONE HCL 1 MG/ML IJ SOLN
0.5000 mg | Freq: Once | INTRAMUSCULAR | Status: AC
  Administered 2023-01-13: 0.5 mg via INTRAVENOUS
  Filled 2023-01-13: qty 1

## 2023-01-13 MED ORDER — SODIUM CHLORIDE 0.9 % IV BOLUS
1000.0000 mL | Freq: Once | INTRAVENOUS | Status: AC
  Administered 2023-01-13: 1000 mL via INTRAVENOUS

## 2023-01-13 MED ORDER — OXYCODONE HCL 5 MG PO TABS
5.0000 mg | ORAL_TABLET | ORAL | Status: AC
  Administered 2023-01-13: 5 mg via ORAL
  Filled 2023-01-13: qty 1

## 2023-01-13 MED ORDER — TETANUS-DIPHTH-ACELL PERTUSSIS 5-2.5-18.5 LF-MCG/0.5 IM SUSY
0.5000 mL | PREFILLED_SYRINGE | Freq: Once | INTRAMUSCULAR | Status: AC
  Administered 2023-01-13: 0.5 mL via INTRAMUSCULAR
  Filled 2023-01-13: qty 0.5
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
## 2023-01-13 NOTE — ED Notes (Signed)
Patient transported to CT 
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
## 2023-01-14 ENCOUNTER — Emergency Department (HOSPITAL_COMMUNITY): Payer: Commercial Managed Care - HMO

## 2023-01-14 MED ORDER — FENTANYL CITRATE PF 50 MCG/ML IJ SOSY
50.0000 ug | PREFILLED_SYRINGE | Freq: Once | INTRAMUSCULAR | Status: AC
  Administered 2023-01-14: 50 ug via INTRAVENOUS
  Filled 2023-01-14: qty 1

## 2023-01-14 NOTE — ED Provider Notes (Signed)
Results for orders placed or performed during the hospital encounter of 09/26/22  Culture, blood (routine x 2)   Specimen: BLOOD RIGHT ARM  Result Value Ref Range   Specimen Description BLOOD RIGHT ARM    Special Requests      BOTTLES DRAWN AEROBIC AND ANAEROBIC Blood Culture adequate volume   Culture      NO GROWTH 5 DAYS Performed at [facility], [address] [place], [place] [address]    Report Status 10/01/2022 FINAL   Culture, blood (routine x 2)   Specimen: BLOOD  Result Value Ref Range   Specimen Description BLOOD BLOOD RIGHT HAND    Special Requests      BOTTLES DRAWN AEROBIC AND ANAEROBIC Blood Culture adequate volume   Culture      NO GROWTH 5 DAYS Performed at [facility], [address] [place], [place] [address]    Report Status 10/01/2022 FINAL   Surgical pcr screen   Specimen: Nasal Mucosa; Nasal Swab  Result Value Ref Range   MRSA, PCR NEGATIVE NEGATIVE   Staphylococcus aureus NEGATIVE NEGATIVE  Aerobic/Anaerobic Culture w Gram Stain (surgical/deep wound)   Specimen: Soft Tissue, Other  Result Value Ref Range   Specimen Description TISSUE    Special Requests SWAB OF PREVERTEBRAL TISSUE NO 1    Gram Stain NO WBC SEEN NO ORGANISMS SEEN     Culture      RARE STREPTOCOCCUS PNEUMONIAE SUSCEPTIBILITIES PERFORMED ON PREVIOUS CULTURE WITHIN THE LAST 5 DAYS. NO ANAEROBES ISOLATED Performed at [facility], [address] [place], [place] [address]    Report Status 10/01/2022 FINAL   Aerobic/Anaerobic Culture w Gram Stain (surgical/deep wound)   Specimen: Soft Tissue, Other  Result Value Ref Range   Specimen Description TISSUE    Special Requests SWAB OF PREVERTEBRAL SOFFT TISSUE NO 2    Gram Stain NO WBC SEEN NO ORGANISMS SEEN     Culture      RARE STREPTOCOCCUS PNEUMONIAE NO ANAEROBES ISOLATED Performed at [facility], [address], [place], [place] [address]    Report Status 10/01/2022 FINAL    Organism ID, Bacteria  STREPTOCOCCUS PNEUMONIAE       Susceptibility   Streptococcus pneumoniae - MIC*    ERYTHROMYCIN <=0.12 SENSITIVE Sensitive     LEVOFLOXACIN 0.5 SENSITIVE Sensitive     VANCOMYCIN 0.25 SENSITIVE Sensitive     PENICILLIN (meningitis) <=0.06 SENSITIVE Sensitive     PENO - penicillin <=0.06      PENICILLIN (non-meningitis) <=0.06 SENSITIVE Sensitive     PENICILLIN (oral) <=0.06 SENSITIVE Sensitive     CEFTRIAXONE (non-meningitis) <=0.12 SENSITIVE Sensitive     CEFTRIAXONE (meningitis) <=0.12 SENSITIVE Sensitive     * RARE STREPTOCOCCUS PNEUMONIAE  Lactic acid, plasma  Result Value Ref Range   Lactic Acid, Venous 0.8 0.5 - 1.9 mmol/L  Comprehensive metabolic panel  Result Value Ref Range   Sodium 132 (L) 135 - 145 mmol/L   Potassium 3.6 3.5 - 5.1 mmol/L   Chloride 102 98 - 111 mmol/L   CO2 19 (L) 22 - 32 mmol/L   Glucose, Bld 84 70 - 99 mg/dL   BUN 12 6 - 20 mg/dL   Creatinine, Ser 4.13 0.61 - 1.24 mg/dL   Calcium 8.9 8.9 - 24.4 mg/dL   Total Protein 8.7 (H) 6.5 - 8.1 g/dL   Albumin 3.0 (L) 3.5 - 5.0 g/dL   AST 24 15 - 41 U/L   ALT  17 0 - 44 U/L   Alkaline Phosphatase 54 38 - 126 U/L   Total Bilirubin 0.7 0.3 - 1.2 mg/dL   GFR, Estimated >33 >29 mL/min   Anion gap 11 5 - 15  CBC with Differential  Result Value Ref Range   WBC 7.9 4.0 - 10.5 K/uL   RBC 3.37 (L) 4.22 - 5.81 MIL/uL   Hemoglobin 11.4 (L) 13.0 - 17.0 g/dL   HCT 51.8 (L) 84.1 - 66.0 %   MCV 99.7 80.0 - 100.0 fL   MCH 33.8 26.0 - 34.0 pg   MCHC 33.9 30.0 - 36.0 g/dL   RDW 63.0 16.0 - 10.9 %   Platelets 435 (H) 150 - 400 K/uL   nRBC 0.0 0.0 - 0.2 %   Neutrophils Relative % 68 %   Neutro Abs 5.3 1.7 - 7.7 K/uL   Lymphocytes Relative 22 %   Lymphs Abs 1.7 0.7 - 4.0 K/uL   Monocytes Relative 10 %   Monocytes Absolute 0.8 0.1 - 1.0 K/uL   Eosinophils Relative 0 %   Eosinophils Absolute 0.0 0.0 - 0.5 K/uL   Basophils Relative 0 %   Basophils Absolute 0.0 0.0 - 0.1 K/uL   Immature Granulocytes 0 %   Abs Immature  Granulocytes 0.02 0.00 - 0.07 K/uL  Protime-INR  Result Value Ref Range   Prothrombin Time 13.9 11.4 - 15.2 seconds   INR 1.1 0.8 - 1.2  APTT  Result Value Ref Range   aPTT 32 24 - 36 seconds  Rapid urine drug screen (hospital performed)  Result Value Ref Range   Opiates NONE DETECTED NONE DETECTED   Cocaine POSITIVE (A) NONE DETECTED   Benzodiazepines NONE DETECTED NONE DETECTED   Amphetamines NONE DETECTED NONE DETECTED   Tetrahydrocannabinol NONE DETECTED NONE DETECTED   Barbiturates NONE DETECTED NONE DETECTED  HIV Antibody (routine testing w rflx)  Result Value Ref Range   HIV Screen 4th Generation wRfx Non Reactive Non Reactive  CBC  Result Value Ref Range   WBC 8.5 4.0 - 10.5 K/uL   RBC 3.15 (L) 4.22 - 5.81 MIL/uL   Hemoglobin 10.8 (L) 13.0 - 17.0 g/dL   HCT 32.3 (L) 55.7 - 32.2 %   MCV 100.6 (H) 80.0 - 100.0 fL   MCH 34.3 (H) 26.0 - 34.0 pg   MCHC 34.1 30.0 - 36.0 g/dL   RDW 02.5 42.7 - 06.2 %   Platelets 381 150 - 400 K/uL   nRBC 0.0 0.0 - 0.2 %  Basic metabolic panel  Result Value Ref Range   Sodium 134 (L) 135 - 145 mmol/L   Potassium 4.2 3.5 - 5.1 mmol/L   Chloride 108 98 - 111 mmol/L   CO2 20 (L) 22 - 32 mmol/L   Glucose, Bld 131 (H) 70 - 99 mg/dL   BUN 27 (H) 6 - 20 mg/dL   Creatinine, Ser 3.76 0.61 - 1.24 mg/dL   Calcium 8.1 (L) 8.9 - 10.3 mg/dL   GFR, Estimated >28 >31 mL/min   Anion gap 6 5 - 15  Ferritin  Result Value Ref Range   Ferritin 390 (H) 24 - 336 ng/mL  Iron and TIBC  Result Value Ref Range   Iron 67 45 - 182 ug/dL   TIBC 517 (L) 616 - 073 ug/dL   Saturation Ratios 30 17.9 - 39.5 %   UIBC 156 ug/dL  CBC  Result Value Ref Range   WBC 6.0 4.0 - 10.5 K/uL   RBC  3.06 (L) 4.22 - 5.81 MIL/uL   Hemoglobin 10.6 (L) 13.0 - 17.0 g/dL   HCT 10.2 (L) 72.5 - 36.6 %   MCV 97.4 80.0 - 100.0 fL   MCH 34.6 (H) 26.0 - 34.0 pg   MCHC 35.6 30.0 - 36.0 g/dL   RDW 44.0 34.7 - 42.5 %   Platelets 360 150 - 400 K/uL   nRBC 0.0 0.0 - 0.2 %  Basic  metabolic panel  Result Value Ref Range   Sodium 134 (L) 135 - 145 mmol/L   Potassium 4.1 3.5 - 5.1 mmol/L   Chloride 107 98 - 111 mmol/L   CO2 22 22 - 32 mmol/L   Glucose, Bld 108 (H) 70 - 99 mg/dL   BUN 16 6 - 20 mg/dL   Creatinine, Ser 9.56 0.61 - 1.24 mg/dL   Calcium 8.1 (L) 8.9 - 10.3 mg/dL   GFR, Estimated >38 >75 mL/min   Anion gap 5 5 - 15  CBC  Result Value Ref Range   WBC 5.7 4.0 - 10.5 K/uL   RBC 3.30 (L) 4.22 - 5.81 MIL/uL   Hemoglobin 11.5 (L) 13.0 - 17.0 g/dL   HCT 64.3 (L) 32.9 - 51.8 %   MCV 96.4 80.0 - 100.0 fL   MCH 34.8 (H) 26.0 - 34.0 pg   MCHC 36.2 (H) 30.0 - 36.0 g/dL   RDW 84.1 66.0 - 63.0 %   Platelets 363 150 - 400 K/uL   nRBC 0.0 0.0 - 0.2 %  Basic metabolic panel  Result Value Ref Range   Sodium 134 (L) 135 - 145 mmol/L   Potassium 3.9 3.5 - 5.1 mmol/L   Chloride 104 98 - 111 mmol/L   CO2 24 22 - 32 mmol/L   Glucose, Bld 112 (H) 70 - 99 mg/dL   BUN 11 6 - 20 mg/dL   Creatinine, Ser 1.60 0.61 - 1.24 mg/dL   Calcium 8.5 (L) 8.9 - 10.3 mg/dL   GFR, Estimated >10 >93 mL/min   Anion gap 6 5 - 15  CBC  Result Value Ref Range   WBC 5.5 4.0 - 10.5 K/uL   RBC 3.45 (L) 4.22 - 5.81 MIL/uL   Hemoglobin 11.9 (L) 13.0 - 17.0 g/dL   HCT 23.5 (L) 57.3 - 22.0 %   MCV 97.4 80.0 - 100.0 fL   MCH 34.5 (H) 26.0 - 34.0 pg   MCHC 35.4 30.0 - 36.0 g/dL   RDW 25.4 27.0 - 62.3 %   Platelets 352 150 - 400 K/uL   nRBC 0.0 0.0 - 0.2 %  Basic metabolic panel  Result Value Ref Range   Sodium 131 (L) 135 - 145 mmol/L   Potassium 4.2 3.5 - 5.1 mmol/L   Chloride 101 98 - 111 mmol/L   CO2 24 22 - 32 mmol/L   Glucose, Bld 108 (H) 70 - 99 mg/dL   BUN 19 6 - 20 mg/dL   Creatinine, Ser 7.62 0.61 - 1.24 mg/dL   Calcium 9.1 8.9 - 83.1 mg/dL   GFR, Estimated >51 >76 mL/min   Anion gap 6 5 - 15  CBC  Result Value Ref Range   WBC 5.5 4.0 - 10.5 K/uL   RBC 3.62 (L) 4.22 - 5.81 MIL/uL   Hemoglobin 12.5 (L) 13.0 - 17.0 g/dL   HCT 16.0 (L) 73.7 - 10.6 %   MCV 98.1 80.0 -  100.0 fL   MCH 34.5 (H) 26.0 - 34.0 pg   MCHC 35.2 30.0 -  36.0 g/dL   RDW 16.1 09.6 - 04.5 %   Platelets 369 150 - 400 K/uL   nRBC 0.0 0.0 - 0.2 %  Surgical pathology  Result Value Ref Range   SURGICAL PATHOLOGY      SURGICAL PATHOLOGY CASE: [id number] PATIENT: Larry Lester Surgical Pathology Report     Clinical History: cervical discitis (cm)     FINAL MICROSCOPIC DIAGNOSIS:  A. SOFT TISSUE, PREVERTEBRAL, BIOPSY: - Fragments of fibroconnective tissue and skeletal muscle showing acutely inflamed granulation tissue, consistent with an abscess - Scant fragments of bone with reactive changes      GROSS DESCRIPTION:  Received fresh are 1.8 x 1.5 x 0.3 cm of soft tan-pink tissue.  The specimen is entirely submitted in 1 cassette.  [facility] 09/26/2022)   Final Diagnosis performed by Holley Bouche, MD.   Electronically signed 09/27/2022 Technical component performed at [facility]. [facility], [address] [place], [place] [address].  Professional component performed at [facility], [address] [place], [place] [address].  Immunohistochemistry Technical component (if applicable) was performed at [facility]. [address], [place], [place] [address].   IMMUNOHISTOCHEMISTRY DISCLAIMER (if applicable): Some of these immunohistochemical stains may have been developed and the performance characteristics determine by [facility]. Some may not have been cleared or approved by the U.S. Food and Drug Administration. The FDA has determined that such clearance or approval is not necessary. This test is used for clinical purposes. It should not be regarded as investigational or for research. This laboratory is certified under the Clinical Laboratory Improvement Amendments of 1988 (CLIA-88) as qualified to perform high complexity clinical laboratory testing.  The controls stained appropriately.    CT  Head Wo Contrast  Result Date: 01/14/2023 CLINICAL DATA:  Follow-up subdural hemorrhage EXAM: CT HEAD WITHOUT CONTRAST TECHNIQUE: Contiguous axial images were obtained from the base of the skull through the vertex without intravenous contrast. RADIATION DOSE REDUCTION: This exam was performed according to the departmental dose-optimization program which includes automated exposure control, adjustment of the mA and/or kV according to patient size and/or use of iterative reconstruction technique. COMPARISON:  CT head 01/13/2023 at 4:40 p.m. FINDINGS: Brain: Redistributed acute subdural hemorrhage along the anterior left falx now measuring 2 mm in thickness (3/12) and subarachnoid hemorrhage in the right sylvian fissure (circa series 3/image 15-16). No new intracranial hemorrhage, mass effect, or evidence of acute infarct. No hydrocephalus. No extra-axial fluid collection. Vascular: No hyperdense vessel or unexpected calcification. Skull: No fracture or focal lesion. Sinuses/Orbits: No acute finding. Other: None. IMPRESSION: Redistributed acute subdural hemorrhage along the anterior left falx now measuring 2 mm in thickness. Redistributed less conspicuous subarachnoid hemorrhage in the right Sylvian fissure. No new intracranial hemorrhage.  No mass effect or midline shift. Electronically Signed   By: Minerva Fester M.D.   On: 01/14/2023 01:22    Repeat head CT with decreased size of subdural to 2mm (previously measured at 4mm).  No new bleeding noted.  Vitals remain stable.  Appropriate for discharge.  Given OP neurosurgery follow-up.  Advised to avoid ASA and other blood thinning medications.  Can return here for any new/acute changes.   Garlon Hatchet, PA-C 01/14/23 0330    Tilden Fossa, MD 01/14/23 [phone]

## 2023-01-14 NOTE — Discharge Instructions (Signed)
Recommend tylenol for pain.  I would avoid aspirin for now. Follow-up with neurosurgery clinic-- I have listed contact information. Return to the ED for new or worsening symptoms.

## 2023-03-29 ENCOUNTER — Emergency Department (HOSPITAL_COMMUNITY)
Admission: EM | Admit: 2023-03-29 | Discharge: 2023-03-29 | Disposition: A | Discharge status: Home / Self Care | Payer: Managed Care, Other (non HMO) | Attending: Emergency Medicine | Admitting: Emergency Medicine

## 2023-03-29 ENCOUNTER — Emergency Department (HOSPITAL_COMMUNITY): Payer: Managed Care, Other (non HMO)

## 2023-03-29 ENCOUNTER — Other Ambulatory Visit: Payer: Self-pay

## 2023-03-29 ENCOUNTER — Encounter (HOSPITAL_COMMUNITY): Payer: Self-pay

## 2023-03-29 DIAGNOSIS — M25531 Pain in right wrist: Secondary | ICD-10-CM | POA: Diagnosis not present

## 2023-03-29 DIAGNOSIS — J069 Acute upper respiratory infection, unspecified: Secondary | ICD-10-CM | POA: Diagnosis not present

## 2023-03-29 DIAGNOSIS — W01198A Fall on same level from slipping, tripping and stumbling with subsequent striking against other object, initial encounter: Secondary | ICD-10-CM | POA: Diagnosis not present

## 2023-03-29 DIAGNOSIS — M25532 Pain in left wrist: Secondary | ICD-10-CM | POA: Diagnosis not present

## 2023-03-29 DIAGNOSIS — W19XXXA Unspecified fall, initial encounter: Secondary | ICD-10-CM

## 2023-03-29 DIAGNOSIS — M545 Low back pain, unspecified: Secondary | ICD-10-CM | POA: Insufficient documentation

## 2023-03-29 DIAGNOSIS — M542 Cervicalgia: Secondary | ICD-10-CM | POA: Insufficient documentation

## 2023-03-29 DIAGNOSIS — R509 Fever, unspecified: Secondary | ICD-10-CM

## 2023-03-29 HISTORY — DX: Osteomyelitis of vertebra, cervical region: M46.22

## 2023-03-29 HISTORY — DX: Nontraumatic subdural hemorrhage, unspecified: I62.00

## 2023-03-29 MED ORDER — METHOCARBAMOL 500 MG PO TABS: 500.0000 mg | ORAL_TABLET | Freq: Two times a day (BID) | ORAL | 0 refills | Status: DC

## 2023-03-29 MED ORDER — METHOCARBAMOL 500 MG PO TABS
500.0000 mg | ORAL_TABLET | Freq: Once | ORAL | Status: AC
  Administered 2023-03-29: 500 mg via ORAL
  Filled 2023-03-29: qty 1

## 2023-03-29 MED ORDER — OXYCODONE-ACETAMINOPHEN 5-325 MG PO TABS
1.0000 | ORAL_TABLET | Freq: Once | ORAL | Status: AC
  Administered 2023-03-29: 1 via ORAL
  Filled 2023-03-29: qty 1

## 2023-03-29 NOTE — ED Provider Notes (Signed)
[place] [facility]GENC[facility] DEPARTMENT AT [facility] Provider Note   CSN: [id number] Arrival date & time: 03/29/23  1003     History  Chief Complaint  Patient presents with   Larry Lester is a 52 y.o. male.  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  He states that same occurred yesterday when he tripped and fell on his outstretched bilateral hands.  He does not think he hit his head and did not lose consciousness.  Soon after noted bilateral wrist pain as well as pain in his neck and lower back.  He has been able to walk without issue.  No loss of bowel or bladder function or saddle paresthesias.  He does not have a headache, sharp shooting pain down his extremities, or numbness/tingling.  No weakness.  He is able to walk without issue.  Additionally, of note patient does mention that he has had congestion and intermittent fevers over the last few days that he assumed was from a virus.  His wife is home sick with similar symptoms.  He did not have any neck pain prior to his fall.  Denies any IV drug use.  The history is provided by the patient. No language interpreter was used.  Fall       Home Medications Prior to Admission medications   Medication Sig Start Date End Date Taking? Authorizing Provider  acetaminophen (TYLENOL) 500 MG tablet Take 2 tablets (1,000 mg total) by mouth every 8 (eight) hours. 10/02/22   Marrianne Mood, MD  hydrOXYzine (ATARAX) 25 MG tablet Take 1 tablet (25 mg total) by mouth every 6 (six) hours as needed for itching. 02/05/22   Jeannie Fend, PA-C  ibuprofen (ADVIL) 800 MG tablet Take 1 tablet (800 mg total) by mouth every 8 (eight) hours as needed. 10/02/22   Marrianne Mood, MD  oxyCODONE (OXY IR/ROXICODONE) 5 MG immediate release tablet Take 1 tablet (5 mg total) by mouth every 6 (six) hours as needed for moderate pain or breakthrough pain ((score 4 to  6)). 10/02/22   Marrianne Mood, MD      Allergies    Parke Simmers allergy]    Review of Systems   Review of Systems  All other systems reviewed and are negative.   Physical Exam Updated Vital Signs BP 109/67 (BP Location: Left Arm)   Pulse 92   Temp (!) 100.4 F (38 C) (Oral)   Resp 16   Ht 5' 6.5" (1.689 m)   Wt 88.5 kg   SpO2 100%   BMI 31.00 kg/m  Physical Exam Vitals and nursing note reviewed.  Constitutional:      General: He is not in acute distress.    Appearance: Normal appearance. He is normal weight. He is not ill-appearing, toxic-appearing or diaphoretic.  HENT:     Head: Normocephalic and atraumatic.     Comments: No racoon eyes No battle sign Eyes:     Extraocular Movements: Extraocular movements intact.     Pupils: Pupils are equal, round, and reactive to light.  Neck:     Comments: Mild generalized paraspinous tenderness to palpation to bilateral aspects of the cervical spine without focal bony tenderness.  No erythema, warmth, fluctuance, induration, or overlying skin changes.  Given patient's history of osteomyelitis of the cervical spine and mild tenderness, patient placed in a c-collar pending imaging Cardiovascular:     Rate and Rhythm:  Normal rate and regular rhythm.     Heart sounds: Normal heart sounds.  Pulmonary:     Effort: Pulmonary effort is normal. No respiratory distress.     Breath sounds: Normal breath sounds.  Chest:     Comments: No chest TTP Abdominal:     General: Abdomen is flat.     Palpations: Abdomen is soft.     Tenderness: There is no abdominal tenderness.  Musculoskeletal:        General: Normal range of motion.     Cervical back: Normal, normal range of motion and neck supple.     Thoracic back: Normal.     Lumbar back: Normal.     Comments: No midline tenderness, no stepoffs or deformity noted on palpation of cervical, thoracic, and lumbar spine  TTP noted to the dorsal aspect of the right hand. Trace swelling  noted. No erythema, deformity, warmth, fluctuance, induration, or overlying skin changes. ROM intact with some discomfort.  Radial and ulnar pulse intact and 2+.  No snuffbox tenderness.  TTP noted to the left hand as well with no focal tenderness, swelling, erythema, deformity, warmth, fluctuance, induration, or overlying skin changes. ROM intact with some discomfort.  Radial and ulnar pulses intact and 2+.  No snuffbox tenderness.  5/5 strength and sensation intact in bilateral upper and lower extremities.  Patient observed to be ambulatory with steady gait.  Skin:    General: Skin is warm and dry.  Neurological:     General: No focal deficit present.     Mental Status: He is alert and oriented to person, place, and time.     GCS: GCS eye subscore is 4. GCS verbal subscore is 5. GCS motor subscore is 6.  Psychiatric:        Mood and Affect: Mood normal.        Behavior: Behavior normal.     ED Results / Procedures / Treatments   Labs (all labs ordered are listed, but only abnormal results are displayed) Labs Reviewed - No data to display  EKG None  Radiology CT Head Wo Contrast  Result Date: 03/29/2023 CLINICAL DATA:  Trauma, pain and midline tenderness EXAM: CT HEAD WITHOUT CONTRAST CT CERVICAL SPINE WITHOUT CONTRAST TECHNIQUE: Multidetector CT imaging of the head and cervical spine was performed following the standard protocol without intravenous contrast. Multiplanar CT image reconstructions of the cervical spine were also generated. RADIATION DOSE REDUCTION: This exam was performed according to the departmental dose-optimization program which includes automated exposure control, adjustment of the mA and/or kV according to patient size and/or use of iterative reconstruction technique. COMPARISON:  CT head and cervical spine 01/13/2023 FINDINGS: CT HEAD FINDINGS Brain: There is no acute intracranial hemorrhage, extra-axial fluid collection, or acute infarct. Previously seen left  parafalcine subdural hemorrhage has resolved. Background parenchymal volume is normal. The ventricles are normal in size. Gray-white differentiation is preserved. There is no mass lesion. There is no mass effect or midline shift. The pituitary and suprasellar region are normal. Vascular: No hyperdense vessel or unexpected calcification. Skull: Normal. Negative for fracture or focal lesion. Sinuses/Orbits: The paranasal sinuses are clear. The globes and orbits are unremarkable. Other: The mastoid air cells and middle ear cavities are clear. CT CERVICAL SPINE FINDINGS Alignment: There is unchanged reversal of the normal curvature C6-C7. There is no evidence of traumatic malalignment. Skull base and vertebrae: Skull base alignment is maintained. There is unchanged chronic compression deformity and osseous fusion of the C6 and C7 vertebral  bodies, likely postinfectious. The other vertebral body heights are preserved. There is no evidence of new acute fracture. There is no suspicious osseous lesion. Soft tissues and spinal canal: No prevertebral fluid or swelling. No visible canal hematoma. Disc levels: There is degenerative endplate change with bilateral uncovertebral ridging at C5-C6. There is no high-grade spinal canal stenosis. Upper chest: The imaged lung apices are clear. Other: None. IMPRESSION: 1. No acute intracranial pathology. 2. No acute fracture or traumatic malalignment of the cervical spine. Electronically Signed   By: Lesia Hausen M.D.   On: 03/29/2023 11:38   CT Cervical Spine Wo Contrast  Result Date: 03/29/2023 CLINICAL DATA:  Trauma, pain and midline tenderness EXAM: CT HEAD WITHOUT CONTRAST CT CERVICAL SPINE WITHOUT CONTRAST TECHNIQUE: Multidetector CT imaging of the head and cervical spine was performed following the standard protocol without intravenous contrast. Multiplanar CT image reconstructions of the cervical spine were also generated. RADIATION DOSE REDUCTION: This exam was performed  according to the departmental dose-optimization program which includes automated exposure control, adjustment of the mA and/or kV according to patient size and/or use of iterative reconstruction technique. COMPARISON:  CT head and cervical spine 01/13/2023 FINDINGS: CT HEAD FINDINGS Brain: There is no acute intracranial hemorrhage, extra-axial fluid collection, or acute infarct. Previously seen left parafalcine subdural hemorrhage has resolved. Background parenchymal volume is normal. The ventricles are normal in size. Gray-white differentiation is preserved. There is no mass lesion. There is no mass effect or midline shift. The pituitary and suprasellar region are normal. Vascular: No hyperdense vessel or unexpected calcification. Skull: Normal. Negative for fracture or focal lesion. Sinuses/Orbits: The paranasal sinuses are clear. The globes and orbits are unremarkable. Other: The mastoid air cells and middle ear cavities are clear. CT CERVICAL SPINE FINDINGS Alignment: There is unchanged reversal of the normal curvature C6-C7. There is no evidence of traumatic malalignment. Skull base and vertebrae: Skull base alignment is maintained. There is unchanged chronic compression deformity and osseous fusion of the C6 and C7 vertebral bodies, likely postinfectious. The other vertebral body heights are preserved. There is no evidence of new acute fracture. There is no suspicious osseous lesion. Soft tissues and spinal canal: No prevertebral fluid or swelling. No visible canal hematoma. Disc levels: There is degenerative endplate change with bilateral uncovertebral ridging at C5-C6. There is no high-grade spinal canal stenosis. Upper chest: The imaged lung apices are clear. Other: None. IMPRESSION: 1. No acute intracranial pathology. 2. No acute fracture or traumatic malalignment of the cervical spine. Electronically Signed   By: Lesia Hausen M.D.   On: 03/29/2023 11:38   DG Wrist Complete Right  Result Date:  03/29/2023 CLINICAL DATA:  Fall.  Pain. EXAM: RIGHT WRIST - COMPLETE 3+ VIEW COMPARISON:  None Available. FINDINGS: No fracture. No evidence for dislocation. No worrisome lytic or sclerotic osseous abnormality. Degenerative changes are seen in the MCP joint of the thumb. IMPRESSION: 1. No acute bony findings. 2. Degenerative changes in the MCP joint of the thumb. Electronically Signed   By: Kennith Center M.D.   On: 03/29/2023 11:34   DG Lumbar Spine Complete  Result Date: 03/29/2023 CLINICAL DATA:  Fall, back pain EXAM: LUMBAR SPINE - COMPLETE 4+ VIEW COMPARISON:  Lumbar spine CT 01/13/2023 FINDINGS: There are 5 non-rib-bearing lumbar-type vertebral bodies. Vertebral body heights are preserved, without evidence of acute fracture. Grade 1 retrolisthesis of L5 on S1 is unchanged. Alignment is otherwise normal. There is no evidence of spondylolysis. There is mild disc space narrowing at L5-S1.  The other disc heights are overall preserved. There is overall minimal facet arthropathy. The SI joints are intact. There is no erosive change. The soft tissues are unremarkable. IMPRESSION: 1. No acute finding in the lumbar spine. 2. Disc space narrowing and grade 1 retrolisthesis at L5-S1, unchanged. Electronically Signed   By: Lesia Hausen M.D.   On: 03/29/2023 11:31   DG Wrist Complete Left  Result Date: 03/29/2023 CLINICAL DATA:  Pain after fall EXAM: LEFT WRIST - COMPLETE 4 VIEW COMPARISON:  None Available. FINDINGS: There is no evidence of fracture or dislocation. There is no evidence of arthropathy or other focal bone abnormality. Soft tissues are unremarkable. If there is persistent pain or further concern of scaphoid injury recommend treatment with follow-up imaging in 7-10 days as these injuries can be acutely x-ray occult. IMPRESSION: No acute osseous abnormality. Electronically Signed   By: Karen Kays M.D.   On: 03/29/2023 11:29    Procedures Procedures    Medications Ordered in ED Medications   oxyCODONE-acetaminophen (PERCOCET/ROXICET) 5-325 MG per tablet 1 tablet (has no administration in time range)    ED Course/ Medical Decision Making/ A&P                                 Medical Decision Making Amount and/or Complexity of Data Reviewed Radiology: ordered.  Risk Prescription drug management.   This patient is a 52 y.o. male who presents to the ED for concern of fall, this involves an extensive number of treatment options, and is a complaint that carries with it a high risk of complications and morbidity. The emergent differential diagnosis prior to evaluation includes, but is not limited to,  trauma . This is not an exhaustive differential.   Past Medical History / Co-morbidities / Social History:  has a past medical history of Osteomyelitis of cervical spine (HCC) and Subdural bleeding (HCC).  Additional history: Chart reviewed. Pertinent results include: seen in August for fall and had a subdural hematoma that was not surgically intervened on.  Also had osteomyelitis of the cervical spine that was surgically managed in May, appear to have resolution on CT imaging in August.  Physical Exam: Physical exam performed. The pertinent findings include: see above   Imaging Studies: I ordered imaging studies including CT head, cervical spine, dg bilateral wrist and lumbar spine. I independently visualized and interpreted imaging which showed   CT:  1. No acute intracranial pathology. 2. No acute fracture or traumatic malalignment of the cervical spine.  DG lumbar spine: 1. No acute finding in the lumbar spine. 2. Disc space narrowing and grade 1 retrolisthesis at L5-S1, unchanged.  DG left wrist: NAD  DG right wrist:  1. No acute bony findings. 2. Degenerative changes in the MCP joint of the thumb.  I agree with the radiologist interpretation.   Medications: I ordered medication including percocet and robaxin  for pain. Reevaluation of the patient after these  medicines showed that the patient improved. I have reviewed the patients home medicines and have made adjustments as needed.   Disposition: After consideration of the diagnostic results and the patients response to treatment, I feel that emergency department workup does not suggest an emergent condition requiring admission or immediate intervention beyond what has been performed at this time. The plan is: Discharge with close outpatient follow-up and return precautions.  Patient's imaging is negative.  I have given him a right wrist brace for  support and referral to Ortho for follow-up.  He was notably febrile at 100.4.  No tachycardia or tachypnea.  He states that he has had congestion and subjective fevers for the last few days and his wife is home sick with similar symptoms.  He suspected he had a URI.  No shortness of breath or chest pain.  He does notably have a history of osteomyelitis of the cervical spine, however states that his neck did not start hurting until after he fell and it does not feel at all similar to his previous osteomyelitis.  He has no difficulty with range of motion and no midline cervical spine tenderness.  No neurologic deficits.  His CT scan was benign whereas in May it did show his osteomyelitis.  Shared decision making, offered further evaluation with labs and an x-ray, however patient is not concerned about the fever and would prefer to go home.  He requested Robaxin as it has helped him in the past.  Prescription sent for same.  Patient vies not to drive or operate heavy machinery while taking this medication.  Also given referral to orthopedics for follow-up evaluation of his wrist pain as this was his biggest complaint when he came in. Bracing given for support.  No concern for septic arthritis or flexor tenosynovitis.  Recommend Tylenol/ibuprofen for fevers with close outpatient follow-up and return precautions.  Evaluation and diagnostic testing in the emergency department  does not suggest an emergent condition requiring admission or immediate intervention beyond what has been performed at this time.  Plan for discharge with close PCP follow-up.  Patient is understanding and amenable with plan, educated on red flag symptoms that would prompt immediate return.  Patient discharged in stable condition.   I discussed this case with my attending physician Dr. Renaye Rakers who cosigned this note including patient's presenting symptoms, physical exam, and planned diagnostics and interventions. Attending physician stated agreement with plan or made changes to plan which were implemented.    Final Clinical Impression(s) / ED Diagnoses Final diagnoses:  Fall, initial encounter  Viral URI  Fever, unspecified fever cause    Rx / DC Orders ED Discharge Orders          Ordered    methocarbamol (ROBAXIN) 500 MG tablet  2 times daily        03/29/23 1530          An After Visit Summary was printed and given to the patient.     Vear Clock 03/29/23 1707    Terald Sleeper, MD 03/30/23 [phone]

## 2023-03-29 NOTE — ED Provider Triage Note (Signed)
Emergency Medicine Provider Triage Evaluation Note  Larry Lester , a 52 y.o. male  was evaluated in triage.  Pt complains of fall. States that same occurred yesterday. Tripped and fell on both wrists. Doesn't think he hit his head. Notes neck and back pain and pain to bilateral wrists. No headache  Review of Systems  Positive:  Negative:   Physical Exam  BP 95/60 (BP Location: Left Arm)   Pulse 94   Temp 99 F (37.2 C)   Resp 17   Ht 5' 6.5" (1.689 m)   Wt 88.5 kg   SpO2 99%   BMI 31.00 kg/m  Gen:   Awake, no distress   Resp:  Normal effort  MSK:   Moves extremities without difficulty  Other:    Medical Decision Making  Medically screening exam initiated at 10:32 AM.  Appropriate orders placed.  [name] was informed that the remainder of the evaluation will be completed by another provider, this initial triage assessment does not replace that evaluation, and the importance of remaining in the ED until their evaluation is complete.  Patient with history of subdural hematoma in August 2024 and osteomyelitis of the cervical spine in May 2024. Given this with patient's complaints and unreliable historian, CT imaging ordered of head and neck and patient placed in a c collar. He is alert and oriented and neurologically intact without focal deficits.   Work-up initiated   Vear Clock 03/29/23 1035

## 2023-03-29 NOTE — ED Notes (Signed)
Provider notified of fever

## 2023-03-29 NOTE — ED Triage Notes (Signed)
Patient complains of bilateral arm/shoulder pain with numbness to hands, lower back pain, bilateral leg pain and numbness.  Reports tripped yesterday and landed on bilateral hands to catch self and his hands started cramping.  Hx of osteo of cervical spine and subdural.

## 2023-03-29 NOTE — Discharge Instructions (Signed)
As we discussed, your workup in the ER today was reassuring for acute findings.  CT imaging of your head and neck and x-rays of your wrist did not reveal any emergent concerns.  I have given you a brace to wear for support and given you a referral to orthopedics with a number to call to schedule follow-up appointment.  I have also given you a prescription for muscle relaxer for you to take as prescribed as needed for any residual muscle soreness.  Do not drive or operate heavy machinery while taking this medication as it can be sedating.  You may also take Tylenol/ibuprofen as needed for additional pain relief.  Additionally, you did have a low-grade fever today.  Given that you have had cough and congestion for the last few days, I suspect that you have a virus.  I did offer additional workup for this, however he would prefer to go home and manage her symptoms.  If anything gets worse I would recommend returning for additional evaluation.  Return to the development of any new or worsening symptoms.

## 2023-06-24 ENCOUNTER — Other Ambulatory Visit: Payer: Self-pay

## 2023-06-24 ENCOUNTER — Emergency Department (HOSPITAL_COMMUNITY)
Admission: EM | Admit: 2023-06-24 | Discharge: 2023-06-25 | Disposition: A | Discharge status: Home / Self Care | Payer: Self-pay | Attending: Emergency Medicine | Admitting: Emergency Medicine

## 2023-06-24 ENCOUNTER — Encounter (HOSPITAL_COMMUNITY): Payer: Self-pay | Admitting: *Deleted

## 2023-06-24 DIAGNOSIS — X500XXA Overexertion from strenuous movement or load, initial encounter: Secondary | ICD-10-CM | POA: Insufficient documentation

## 2023-06-24 DIAGNOSIS — M545 Low back pain, unspecified: Secondary | ICD-10-CM | POA: Insufficient documentation

## 2023-06-24 NOTE — ED Triage Notes (Signed)
Pt states he has been having ongoing back pain, but today, he was sitting on a hard bench today, and having increased lower right back pain. Has been lifting a lot of heavy bags. He is out of his robaxin and oxycodone for pain. Denies loss of bowel/bladder.

## 2023-06-25 ENCOUNTER — Emergency Department (HOSPITAL_COMMUNITY): Payer: Self-pay

## 2023-06-25 LAB — CBC WITH DIFFERENTIAL/PLATELET
Abs Immature Granulocytes: 0.02 10*3/uL (ref 0.00–0.07)
Basophils Absolute: 0 10*3/uL (ref 0.0–0.1)
Basophils Relative: 0 %
Eosinophils Absolute: 0.2 10*3/uL (ref 0.0–0.5)
Eosinophils Relative: 2 %
HCT: 37.9 % — ABNORMAL LOW (ref 39.0–52.0)
Hemoglobin: 13.5 g/dL (ref 13.0–17.0)
Immature Granulocytes: 0 %
Lymphocytes Relative: 54 %
Lymphs Abs: 4.3 10*3/uL — ABNORMAL HIGH (ref 0.7–4.0)
MCH: 35.3 pg — ABNORMAL HIGH (ref 26.0–34.0)
MCHC: 35.6 g/dL (ref 30.0–36.0)
MCV: 99.2 fL (ref 80.0–100.0)
Monocytes Absolute: 0.5 10*3/uL (ref 0.1–1.0)
Monocytes Relative: 6 %
Neutro Abs: 3.1 10*3/uL (ref 1.7–7.7)
Neutrophils Relative %: 38 %
Platelets: 240 10*3/uL (ref 150–400)
RBC: 3.82 MIL/uL — ABNORMAL LOW (ref 4.22–5.81)
RDW: 13.1 % (ref 11.5–15.5)
WBC: 8.1 10*3/uL (ref 4.0–10.5)
nRBC: 0 % (ref 0.0–0.2)

## 2023-06-25 LAB — BASIC METABOLIC PANEL
Anion gap: 9 (ref 5–15)
BUN: 20 mg/dL (ref 6–20)
CO2: 23 mmol/L (ref 22–32)
Calcium: 9.2 mg/dL (ref 8.9–10.3)
Chloride: 107 mmol/L (ref 98–111)
Creatinine, Ser: 0.69 mg/dL (ref 0.61–1.24)
GFR, Estimated: >60 mL/min (ref >60)
Glucose, Bld: 104 mg/dL — ABNORMAL HIGH (ref 70–99)
Potassium: 3.9 mmol/L (ref 3.5–5.1)
Sodium: 139 mmol/L (ref 135–145)

## 2023-06-25 MED ORDER — METHOCARBAMOL 500 MG PO TABS: 500.0000 mg | ORAL_TABLET | Freq: Two times a day (BID) | ORAL | 0 refills | Status: DC | PRN

## 2023-06-25 MED ORDER — GADOBUTROL 1 MMOL/ML IV SOLN
8.0000 mL | Freq: Once | INTRAVENOUS | Status: AC | PRN
  Administered 2023-06-25: 8 mL via INTRAVENOUS

## 2023-06-25 MED ORDER — LIDOCAINE 5 % EX PTCH
1.0000 | MEDICATED_PATCH | CUTANEOUS | Status: DC
  Administered 2023-06-25: 1 via TRANSDERMAL
  Filled 2023-06-25: qty 1

## 2023-06-25 MED ORDER — OXYCODONE-ACETAMINOPHEN 5-325 MG PO TABS
1.0000 | ORAL_TABLET | Freq: Once | ORAL | Status: AC
  Administered 2023-06-25: 1 via ORAL
  Filled 2023-06-25: qty 1

## 2023-06-25 NOTE — Discharge Instructions (Addendum)
You were seen in the ER for evaluation of your lower back pain. Your MRI does not show any infection, but shows similar L5-S1 degenerative change with moderate left and mild right foraminal stenosis. I think this along with some of the muscles is what is causing your pain. I recommend taking 1000mg  of Tylenol and 600mg  of Ibuprofen every 6 hours as needed for pain. I also will send you in some muscle relaxers to take as needed. Please do not drive or operate heavy machinery while on this medication as it will make you sleepy. You can also try topical lidocaine patches to the area as well. Please follow up with your PCP for this. I have included more information on back pain for you to review. If you have any concerns, new or worsening symptoms, please return to the ER for re-evaluation.     Contact a health care provider if: You have pain that is not relieved with rest or medicine. You have increasing pain going down into your legs or buttocks. Your pain does not improve after 2 weeks. You have pain at night. You lose weight without trying. You have a fever or chills. You develop nausea or vomiting. You develop abdominal pain. Get help right away if: You develop new bowel or bladder control problems. You have unusual weakness or numbness in your arms or legs. You feel faint. These symptoms may represent a serious problem that is an emergency. Do not wait to see if the symptoms will go away. Get medical help right away. Call your local emergency services (911 in the [place] Do not drive yourself to the hospital.

## 2023-06-25 NOTE — ED Provider Notes (Signed)
[place] EMERGENCY DEPARTMENT AT [facility] Provider Note   CSN: [id number] Arrival date & time: 06/24/23  2151     History Chief Complaint  Patient presents with   Back Pain    Larry Lester is a 53 y.o. male with h/o osteomyelitis of the cervical spine, discitis, presents to the ER for evaluation of lower right sided back pain since Friday. He reports that he was throwing someone off him that was coming at him. Denies any blunt trauma or falls. Reports that he is currently experiencing homelessness and has had some difficulty sleeping d/t the pain. He was taking some left over narcotics that he had from May when he had the osteomyelitis in his C spine. He reports some relief but ran out of his muscle relaxer and narcotics and has not tried anything else.  He denies any fevers.  Denies any IV drug use ever.  He denies any history of cancer.  Denies any fecal or urinary incontinence or trouble with urination.  Denies any dysuria hematuria.  Denies any saddle anesthesia.  He does not have any trouble walking.  Pain is mainly right sided and does radiate some into his buttock.    Back Pain Associated symptoms: no chest pain, no dysuria, no fever, no numbness and no weakness        Home Medications Prior to Admission medications   Medication Sig Start Date End Date Taking? Authorizing Provider  acetaminophen (TYLENOL) 500 MG tablet Take 2 tablets (1,000 mg total) by mouth every 8 (eight) hours. 10/02/22   Marrianne Mood, MD  hydrOXYzine (ATARAX) 25 MG tablet Take 1 tablet (25 mg total) by mouth every 6 (six) hours as needed for itching. 02/05/22   Jeannie Fend, PA-C  ibuprofen (ADVIL) 800 MG tablet Take 1 tablet (800 mg total) by mouth every 8 (eight) hours as needed. 10/02/22   Marrianne Mood, MD  methocarbamol (ROBAXIN) 500 MG tablet Take 1 tablet (500 mg total) by mouth 2 (two) times daily. 03/29/23   Smoot, Sarah A, PA-C  oxyCODONE (OXY IR/ROXICODONE) 5 MG  immediate release tablet Take 1 tablet (5 mg total) by mouth every 6 (six) hours as needed for moderate pain or breakthrough pain ((score 4 to 6)). 10/02/22   Marrianne Mood, MD      Allergies    Parke Simmers allergy]    Review of Systems   Review of Systems  Constitutional:  Negative for chills and fever.  Respiratory:  Negative for shortness of breath.   Cardiovascular:  Negative for chest pain.  Gastrointestinal:  Negative for abdominal distention, constipation, diarrhea, nausea and vomiting.       Denies any fecal incontinence  Genitourinary:  Negative for dysuria and hematuria.       Denies any urinary incontinence or urinary retention.  Musculoskeletal:  Positive for back pain.       Denies any saddle anesthesia  Neurological:  Negative for weakness and numbness.    Physical Exam Updated Vital Signs BP 106/67 (BP Location: Right Arm)   Pulse 72   Temp 97.6 F (36.4 C) (Oral)   Resp 18   SpO2 97%  Physical Exam Vitals and nursing note reviewed.  Constitutional:      General: He is not in acute distress.    Appearance: He is not toxic-appearing.  Eyes:     General: No scleral icterus. Cardiovascular:     Rate and Rhythm: Normal rate.  Pulmonary:     Effort: Pulmonary  effort is normal. No respiratory distress.  Abdominal:     Palpations: Abdomen is soft.     Tenderness: There is no abdominal tenderness.  Musculoskeletal:       Back:     Right lower leg: No edema.     Left lower leg: No edema.     Comments: No lower extremity edema.  Strength is intact and symmetric in bilateral lower extremities.  Does have positive right straight leg raise.  Color and temperature appear and feel symmetric bilaterally.  Palpable pulses distally.  Compartments are soft.  Patient has tenderness to the marked area above.  No step-offs or deformities.  No midline tenderness to the cervical, thoracic, or lumbar spine.  Skin:    General: Skin is warm and dry.  Neurological:      Mental Status: He is alert.     Motor: No weakness.     ED Results / Procedures / Treatments   Labs (all labs ordered are listed, but only abnormal results are displayed) Labs Reviewed  BASIC METABOLIC PANEL - Abnormal; Notable for the following components:      Result Value   Glucose, Bld 104 (*)    All other components within normal limits  CBC WITH DIFFERENTIAL/PLATELET - Abnormal; Notable for the following components:   RBC 3.82 (*)    HCT 37.9 (*)    MCH 35.3 (*)    Lymphs Abs 4.3 (*)    All other components within normal limits  CBC WITH DIFFERENTIAL/PLATELET    EKG None  Radiology MR Lumbar Spine W Wo Contrast Result Date: 06/25/2023 CLINICAL DATA:  Low back pain, symptoms persist with > 6 wks treatment low back pain, h/o osteo EXAM: MRI LUMBAR SPINE WITHOUT AND WITH CONTRAST TECHNIQUE: Multiplanar and multiecho pulse sequences of the lumbar spine were obtained without and with intravenous contrast. CONTRAST:  8mL GADAVIST GADOBUTROL 1 MMOL/ML IV SOLN COMPARISON:  MRI lumbar spine Sep 27, 2022. FINDINGS: Segmentation: Standard. Alignment:  No substantial sagittal subluxation. Vertebrae: Vertebral body heights are maintained. No bone marrow edema to suggest acute fracture or discitis/osteomyelitis. No suspicious bone lesion. No abnormal enhancement. Conus medullaris and cauda equina: Conus extends to the L1 level. Conus and cauda equina appear normal. Paraspinal and other soft tissues: Unremarkable. Disc levels: T12-L1: No significant disc protrusion, foraminal stenosis, or canal stenosis. L1-L2: No significant disc protrusion, foraminal stenosis, or canal stenosis. L2-L3: Mild disc bulging without significant stenosis. L3-L4: Mild disc bulging without significant stenosis. L4-L5: Mild disc bulging. Mild left foraminal stenosis. Patent canal and right foramen. L5-S1: Disc height loss. Broad disc bulge with superimposed similar versus mildly increased inferiorly directed central disc  protrusion this disc effaces ventral CSF without significant canal stenosis. Similar moderate left and mild right foraminal stenosis. IMPRESSION: 1. No specific evidence of acute abnormality or discitis/osteomyelitis. 2. Similar L5-S1 degenerative change with moderate left and mild right foraminal stenosis. Electronically Signed   By: Feliberto Harts M.D.   On: 06/25/2023 02:03    Procedures Procedures   Medications Ordered in ED Medications  lidocaine (LIDODERM) 5 % 1 patch (1 patch Transdermal Patch Applied 06/25/23 0010)  oxyCODONE-acetaminophen (PERCOCET/ROXICET) 5-325 MG per tablet 1 tablet (1 tablet Oral Given 06/25/23 0010)  gadobutrol (GADAVIST) 1 MMOL/ML injection 8 mL (8 mLs Intravenous Contrast Given 06/25/23 0142)    ED Course/ Medical Decision Making/ A&P  Medical Decision Making Amount and/or Complexity of Data Reviewed Labs: ordered. Radiology: ordered.  Risk Prescription drug management.   53 y.o. male presents to the ER for evaluation of back pain. Differential diagnosis includes but is not limited to Fracture (acute/chronic), muscle strain, cauda equina, spinal stenosis, DDD, ligamentous injury, disk herniation, metastatic cancer, vertebral osteomyelitis, kidney stone, pyelonephritis, AAA, pancreatitis, bowel obstruction, meningitis. Vital signs unremarkable. Physical exam as noted above.   On previous chart evaluation, patient was admitted in May 2024 and was found to have osteomyelitis of the cervical spine as well as epidural phlegmon.  It was thought that it was likely from strep pneumonia.  He denies any IV drug use.  Still denies any IV drug use.  Given this history of epidural phlegmon, discussed my attending recommends ordering MRI with and without contrast.  I independently reviewed and interpreted the patient's labs.  CBC without leukocytosis.  Mildly decreased hematocrit at 37.9 otherwise normal hemoglobin.  BMP shows glucose at 104  otherwise no other electrolyte abnormality.Marland Kitchen  MRI shows 1. No specific evidence of acute abnormality or discitis/osteomyelitis. 2. Similar L5-S1 degenerative change with moderate left and mild right foraminal stenosis.    Patient has been resting comfortably on the stretcher in no acute distress. Per nursing staff, he has been ambulatory. I discussed with him the results and plan. Will refill his muscle relaxers. The MRI shows some degenerative changes which could be contributing to his symptoms. He does not meet any criteria for admission. No abscess.   ***We discussed the results of the labs/imaging. The plan is ***. We discussed strict return precautions and red flag symptoms. The patient verbalized their understanding and agrees to the plan. The patient is stable and being discharged home in good condition.  Portions of this report may have been transcribed using voice recognition software. Every effort was made to ensure accuracy; however, inadvertent computerized transcription errors may be present.   I discussed this case with my attending physician who cosigned this note including patient's presenting symptoms, physical exam, and planned diagnostics and interventions. Attending physician stated agreement with plan or made changes to plan which were implemented.   Attending physician assessed patient at bedside.  Final Clinical Impression(s) / ED Diagnoses Final diagnoses:  Acute right-sided low back pain without sciatica    Rx / DC Orders ED Discharge Orders          Ordered    methocarbamol (ROBAXIN) 500 MG tablet  2 times daily PRN        06/25/23 0454

## 2023-07-11 ENCOUNTER — Emergency Department (HOSPITAL_COMMUNITY)
Admission: EM | Admit: 2023-07-11 | Discharge: 2023-07-12 | Disposition: A | Discharge status: Home / Self Care | Payer: Self-pay | Attending: Emergency Medicine | Admitting: Emergency Medicine

## 2023-07-11 ENCOUNTER — Encounter (HOSPITAL_COMMUNITY): Payer: Self-pay

## 2023-07-11 DIAGNOSIS — Y92007 Garden or yard of unspecified non-institutional (private) residence as the place of occurrence of the external cause: Secondary | ICD-10-CM | POA: Insufficient documentation

## 2023-07-11 DIAGNOSIS — S39012A Strain of muscle, fascia and tendon of lower back, initial encounter: Secondary | ICD-10-CM | POA: Insufficient documentation

## 2023-07-11 DIAGNOSIS — X509XXA Other and unspecified overexertion or strenuous movements or postures, initial encounter: Secondary | ICD-10-CM | POA: Insufficient documentation

## 2023-07-11 NOTE — ED Triage Notes (Signed)
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

## 2023-07-12 ENCOUNTER — Other Ambulatory Visit: Payer: Self-pay

## 2023-07-12 MED ORDER — LIDOCAINE 5 % EX PTCH
1.0000 | MEDICATED_PATCH | CUTANEOUS | Status: DC
  Administered 2023-07-12: 1 via TRANSDERMAL
  Filled 2023-07-12: qty 1

## 2023-07-12 MED ORDER — LIDOCAINE 5 % EX PTCH: 1.0000 | MEDICATED_PATCH | CUTANEOUS | 0 refills | Status: DC

## 2023-07-12 MED ORDER — NAPROXEN 500 MG PO TABS: 500.0000 mg | ORAL_TABLET | Freq: Two times a day (BID) | ORAL | 0 refills | Status: AC

## 2023-07-12 MED ORDER — KETOROLAC TROMETHAMINE 15 MG/ML IJ SOLN
15.0000 mg | Freq: Once | INTRAMUSCULAR | Status: AC
  Administered 2023-07-12: 15 mg via INTRAVENOUS
  Filled 2023-07-12: qty 1

## 2023-07-12 MED ORDER — CYCLOBENZAPRINE HCL 10 MG PO TABS: 10.0000 mg | ORAL_TABLET | Freq: Two times a day (BID) | ORAL | 0 refills | Status: DC | PRN

## 2023-07-12 NOTE — Discharge Instructions (Addendum)
 You were seen in emergency room today for back pain.  I recommend taking naproxen twice daily.  You can also take Tylenol 4 times daily.  I sent lidocaine patches to your pharmacy which she can wear new low back. You can take muscle relaxer called Flexeril. I would also recommend ice and heat.  Return to emergency room if you have any new or worsening symptoms.

## 2023-07-12 NOTE — ED Provider Notes (Signed)
 [place] EMERGENCY DEPARTMENT AT [facility] Provider Note   CSN: [id number] Arrival date & time: 07/11/23  2326     History  Chief Complaint  Patient presents with   Back Pain    Larry Lester is a 53 y.o. male with past medical history of osteomyelitis of cervical spine presenting to emergency room with complaint of low back pain.  Patient reports that yesterday he was pushing a push lawnmower around and doing some yard work.  After he was done with lawn care he started experiencing low back pain.  Patient reports since this event his pain has been intermittent it feels like a muscle soreness.  It is worse when sitting up or laying back down.  He denies any radicular symptoms.  Denies any extremity weakness or change in extremity sensation.  Denies loss of bowel or bladder or saddle anesthesia.  No fevers chills or history of IV drug use or cancer.   Back Pain      Home Medications Prior to Admission medications   Medication Sig Start Date End Date Taking? Authorizing Provider  acetaminophen (TYLENOL) 500 MG tablet Take 2 tablets (1,000 mg total) by mouth every 8 (eight) hours. 10/02/22   Marrianne Mood, MD  hydrOXYzine (ATARAX) 25 MG tablet Take 1 tablet (25 mg total) by mouth every 6 (six) hours as needed for itching. 02/05/22   Jeannie Fend, PA-C  ibuprofen (ADVIL) 800 MG tablet Take 1 tablet (800 mg total) by mouth every 8 (eight) hours as needed. 10/02/22   Marrianne Mood, MD  methocarbamol (ROBAXIN) 500 MG tablet Take 1 tablet (500 mg total) by mouth 2 (two) times daily as needed for muscle spasms. 06/25/23   Achille Rich, PA-C  oxyCODONE (OXY IR/ROXICODONE) 5 MG immediate release tablet Take 1 tablet (5 mg total) by mouth every 6 (six) hours as needed for moderate pain or breakthrough pain ((score 4 to 6)). 10/02/22   Marrianne Mood, MD      Allergies    Parke Simmers allergy]    Review of Systems   Review of Systems  Musculoskeletal:  Positive for  back pain.    Physical Exam Updated Vital Signs BP 123/67 (BP Location: Right Arm)   Pulse 79   Temp 97.7 F (36.5 C)   Resp 18   SpO2 99%  Physical Exam Vitals and nursing note reviewed.  Constitutional:      General: He is not in acute distress.    Appearance: He is not toxic-appearing.  HENT:     Head: Normocephalic and atraumatic.  Eyes:     General: No scleral icterus.    Conjunctiva/sclera: Conjunctivae normal.  Cardiovascular:     Rate and Rhythm: Normal rate and regular rhythm.     Pulses: Normal pulses.     Heart sounds: Normal heart sounds.  Pulmonary:     Effort: Pulmonary effort is normal. No respiratory distress.     Breath sounds: Normal breath sounds.  Abdominal:     General: Abdomen is flat. Bowel sounds are normal.     Palpations: Abdomen is soft.     Tenderness: There is no abdominal tenderness.  Musculoskeletal:     Comments: Patient has bilateral paravertebral musculature tenderness to palpation.  He has no midline tenderness step-off or deformity.  Strength of lower extremity and sensation of lower extremities equal bilaterally.  Skin:    General: Skin is warm and dry.     Findings: No lesion.  Neurological:  General: No focal deficit present.     Mental Status: He is alert and oriented to person, place, and time. Mental status is at baseline.     ED Results / Procedures / Treatments   Labs (all labs ordered are listed, but only abnormal results are displayed) Labs Reviewed - No data to display  EKG None  Radiology No results found.  Procedures Procedures    Medications Ordered in ED Medications - No data to display  ED Course/ Medical Decision Making/ A&P                                 Medical Decision Making Risk Prescription drug management.   Larry Lester 53 y.o. presented today for back pain. Working Ddx: MSK in nature, fracture, epidural hematoma/abscess, cauda equina syndrome, spinal stenosis, spinal malignancy,  discitis, spinal infection, spondylitises/ spondylosis, conus medullaris, DDD of the back.  R/o DDx: Cauda equina syndrome and additional dx are less likely than current impression due to history of present illness, physical exam, labs/imaging findings. No focal neurological deficits, no loss of bowel or bladder control.  Denies fever, night sweats, weight loss, h/o cancer, IVDU.   Imaging: Patient has no red flag symptoms of back pain thus I do not feel emergent MRI is necessary at this time.  Patient has no fall/trauma thus I do not feel x-ray or CT scan is necessary at this time as well.  Tenderness and back pain are located over musculature.  Problem List / ED Course / Critical interventions / Medication management  Patient reporting to the emergency room with complaint of low back pain after he was doing yard work.  Patient is well-appearing and hemodynamically stable.  He is ambulatory with steady gait.  Not reporting radicular symptoms sensation change or weakness.  No red flag symptoms of back pain.  Patient reports that he tried taking Advil yesterday spent over 24 hours.  He is he reports his symptoms are starting to improve.  Given physical exam consistent with muscle strain and no red flag symptoms feel patient is appropriate for discharge and outpatient follow-up. I ordered medication including Toradol, lidocaine patch Reevaluation of the patient after these medicines showed that the patient improved Patients vitals assessed. Upon arrival patient is hemodynamically stable.  I have reviewed the patients home medicines and have made adjustments as needed  Consult: None    Plan:  F/u w/ PCP in 2-3d to ensure resolution of sx.  RICE protocol and pain medicine discussed with patient.  Patient was given return precautions. Patient stable for discharge at this time.  Patient educated on sx/dx and verbalized understanding of plan. Return to ER with new or worsening sx.           Final Clinical Impression(s) / ED Diagnoses Final diagnoses:  Strain of lumbar region, initial encounter    Rx / DC Orders ED Discharge Orders     None         Smitty Knudsen, PA-C 07/12/23 1610    Wynetta Fines, MD 07/12/23 [phone]

## 2023-07-20 ENCOUNTER — Encounter (HOSPITAL_COMMUNITY): Payer: Self-pay | Admitting: Emergency Medicine

## 2023-07-20 ENCOUNTER — Emergency Department (HOSPITAL_COMMUNITY)
Admission: EM | Admit: 2023-07-20 | Discharge: 2023-07-21 | Disposition: A | Discharge status: Home / Self Care | Payer: Self-pay | Attending: Emergency Medicine | Admitting: Emergency Medicine

## 2023-07-20 ENCOUNTER — Other Ambulatory Visit: Payer: Self-pay

## 2023-07-20 DIAGNOSIS — M545 Low back pain, unspecified: Secondary | ICD-10-CM | POA: Insufficient documentation

## 2023-07-20 DIAGNOSIS — R200 Anesthesia of skin: Secondary | ICD-10-CM | POA: Insufficient documentation

## 2023-07-20 DIAGNOSIS — G8929 Other chronic pain: Secondary | ICD-10-CM | POA: Insufficient documentation

## 2023-07-20 NOTE — ED Triage Notes (Signed)
 Pt in with lumbar back pain with bilateral leg numbness. Pt states hx of back surgery last year, has been working outside today and feels the pain is flared. No loss of bowel or bladder control

## 2023-07-21 ENCOUNTER — Other Ambulatory Visit (HOSPITAL_COMMUNITY): Payer: Self-pay

## 2023-07-21 MED ORDER — CYCLOBENZAPRINE HCL 10 MG PO TABS
10.0000 mg | ORAL_TABLET | Freq: Two times a day (BID) | ORAL | 0 refills | Status: DC | PRN
  Filled 2023-07-21: qty 20, 10d supply, fill #0

## 2023-07-21 MED ORDER — KETOROLAC TROMETHAMINE 15 MG/ML IJ SOLN
15.0000 mg | Freq: Once | INTRAMUSCULAR | Status: AC
  Administered 2023-07-21: 15 mg via INTRAMUSCULAR
  Filled 2023-07-21: qty 1

## 2023-07-21 MED ORDER — LIDOCAINE 5 % EX PTCH
1.0000 | MEDICATED_PATCH | CUTANEOUS | 0 refills | Status: DC
Start: 2023-07-21 — End: 2024-02-29
  Filled 2023-07-21: qty 30, 30d supply, fill #0

## 2023-07-21 MED ORDER — LIDOCAINE 5 % EX PTCH
1.0000 | MEDICATED_PATCH | CUTANEOUS | Status: DC
  Administered 2023-07-21: 1 via TRANSDERMAL
  Filled 2023-07-21: qty 1

## 2023-07-21 MED ORDER — ACETAMINOPHEN 500 MG PO TABS: 1000.0000 mg | ORAL_TABLET | Freq: Three times a day (TID) | ORAL | Status: AC

## 2023-07-21 NOTE — ED Provider Notes (Signed)
 [place] EMERGENCY DEPARTMENT AT [facility] Provider Note   CSN: [id number] Arrival date & time: 07/20/23  2145     History  Chief Complaint  Patient presents with   Back Pain   Leg Numbness    Larry Lester is a 53 y.o. male history of chronic back pain, osteomyelitis of cervical spine, subdural bleeding presented for back pain has been present for the past month and a half.  Patient's been seen twice with negative advanced imaging and states this is the same pain.  Last time patient was here he said he got a steroid shot along with Toradol and this seemed to help.  Patient's not been able to afford his medications at the pharmacy and would like his medication sent to the [facility] pharmacy to see if he can afford them there.  Patient denies any IV drug use, fevers, abdominal pain, nauseous vomiting, chest pain, shortness of breath, recent trauma.  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  Patient be able to walk.  Patient denies saddle anesthesia, urinary/bowel incontinence.  Triage note states the patient is endorsing numbness however patient denied this with me.    Home Medications Prior to Admission medications   Medication Sig Start Date End Date Taking? Authorizing Provider  acetaminophen (TYLENOL) 500 MG tablet Take 2 tablets (1,000 mg total) by mouth every 8 (eight) hours. 07/21/23   Netta Corrigan, PA-C  cyclobenzaprine (FLEXERIL) 10 MG tablet Take 1 tablet (10 mg total) by mouth 2 (two) times daily as needed for muscle spasms. 07/21/23   Netta Corrigan, PA-C  hydrOXYzine (ATARAX) 25 MG tablet Take 1 tablet (25 mg total) by mouth every 6 (six) hours as needed for itching. 02/05/22   Jeannie Fend, PA-C  lidocaine (LIDODERM) 5 % Place 1 patch onto the skin daily. Remove & Discard patch within 12 hours or as directed by MD 07/21/23   Netta Corrigan, PA-C  naproxen (NAPROSYN) 500 MG tablet Take 1 tablet (500 mg total) by mouth 2  (two) times daily. 07/12/23   Barrett, Horald Chestnut, PA-C  oxyCODONE (OXY IR/ROXICODONE) 5 MG immediate release tablet Take 1 tablet (5 mg total) by mouth every 6 (six) hours as needed for moderate pain or breakthrough pain ((score 4 to 6)). 10/02/22   Marrianne Mood, MD      Allergies    Parke Simmers allergy]    Review of Systems   Review of Systems  Musculoskeletal:  Positive for back pain.    Physical Exam Updated Vital Signs BP 118/73 (BP Location: Left Arm)   Pulse 64   Temp 97.6 F (36.4 C) (Oral)   Resp 16   Wt 88.5 kg   SpO2 100%   BMI 31.02 kg/m  Physical Exam Constitutional:      General: He is not in acute distress. Cardiovascular:     Rate and Rhythm: Normal rate.     Pulses: Normal pulses.  Musculoskeletal:     Comments: Nontender in the paralumbar musculature, nontender midline, no midline abnormalities Positive straight leg test on the right side 5 out of 5 bilateral hip flexion No midline tenderness or abnormalities palpated  Skin:    General: Skin is warm and dry.     Capillary Refill: Capillary refill takes less than 2 seconds.  Neurological:     Mental Status: He is alert.     Comments: Sensation intact distally 2+ bilateral patellar reflexes Able  to ambulate without difficulty does endorse pain when ambulating  Psychiatric:        Mood and Affect: Mood normal.     ED Results / Procedures / Treatments   Labs (all labs ordered are listed, but only abnormal results are displayed) Labs Reviewed - No data to display  EKG None  Radiology No results found.  Procedures Procedures    Medications Ordered in ED Medications  lidocaine (LIDODERM) 5 % 1 patch (has no administration in time range)  ketorolac (TORADOL) 15 MG/ML injection 15 mg (has no administration in time range)    ED Course/ Medical Decision Making/ A&P                                 Medical Decision Making Risk OTC drugs. Prescription drug management.   Larry Lester 53 y.o. presented today for back pain. Working DDx that I considered at this time includes, but not limited to, MSK, underlying fracture, epidural hematoma/abscess, cauda equina syndrome, spinal stenosis, spinal malignancy, discitis, spinal infection, spondylitises/ spondylosis, conus medullaris, DDD of the back.  R/o DDx: MSK, underlying fracture, epidural hematoma/abscess, cauda equina syndrome, spinal stenosis, spinal malignancy, discitis, spinal infection, spondylitises/ spondylosis, conus medullaris : less likely due to history of present illness, physical exam, labs/imaging findings.  Review of prior external notes: 07/11/2023 ED  Unique Tests and My Interpretation: None  Social Determinants of Health: Can't afford meds  Discussion with Independent Historian: None  Discussion of Management of Tests: None  Risk: Medium: prescription drug management  Risk Stratification Score: None  Plan: On exam patient was no acute distress with stable vitals. No neurological deficits and normal neuro exam.  Patient can walk but states is painful.  No loss of bowel or bladder control.  No concern for cauda equina.  No fever, night sweats, weight loss, h/o cancer, IVDU.  RICE protocol and pain medicine indicated and discussed with patient.  Patient states the Toradol shot helped last time so we will repeat this.  Patient had recent MRI of his lumbar region without any red flag features and states this is the same pain and does not feel he needs imaging again which I agree.  Patient's exam was overall reassuring and so we will treat symptomatically and have him follow-up with a primary care provider.  Patient requested that his medications to be sent to the [facility] pharmacy as he is unable to afford them at his pharmacy listed which is not unreasonable and so we will send his medications to [facility] from when he was here on 14 February.  Patient given Lidoderm patch here and will be given  prescription.  Patient given Flexeril as well.  I spoke to the patient how this medication will make them drowsy and do not operate machinery or drive afterwards to which the patient verbalized understanding acceptance of.  Patient was given return precautions. Patient stable for discharge at this time.  Patient verbalized understanding of plan.  This chart was dictated using voice recognition software.  Despite best efforts to proofread,  errors can occur which can change the documentation meaning.         Final Clinical Impression(s) / ED Diagnoses Final diagnoses:  Chronic back pain, unspecified back location, unspecified back pain laterality    Rx / DC Orders ED Discharge Orders          Ordered    cyclobenzaprine (FLEXERIL)  10 MG tablet  2 times daily PRN        07/21/23 0709    lidocaine (LIDODERM) 5 %  Every 24 hours        07/21/23 0709    acetaminophen (TYLENOL) 500 MG tablet  Every 8 hours        07/21/23 0709              Netta Corrigan, PA-C 07/21/23 1478    Glynn Octave, MD 07/21/23 [phone]

## 2023-07-21 NOTE — Discharge Instructions (Signed)
 We saw you in the ER for back pain. Fortunately, our evaluation is not concerning for emergent pathology such as spinal cord compression or infection.   Often the pain is self limiting, you just need time and supportive medications such as tylenol every 6-8 hours for the next few days. Take the muscle relaxant as needed (however do not operate machinery or drive after using this medication due to its sedating side effects), use the lidocaine patches, and see your primary care doctor for further management if the symptoms continue to linger.   Please use the back exercises to strengthen the back muscles and be very careful with activities in future to prevent similar painful events.  Please return to the ER if your pain becomes excruciating or you start developing new numbness, weakness, urinary incontinence (peeing on self without warning), urinary retention (not able to pee despite bladder feeling full), inability to defecate, pins and needles sensation by your ano-genital area.

## 2023-07-21 NOTE — ED Notes (Signed)
 Pt is in Rm 31 with spouse, also waiting to be seen by provider.

## 2023-07-31 ENCOUNTER — Other Ambulatory Visit (HOSPITAL_COMMUNITY): Payer: Self-pay

## 2023-08-07 ENCOUNTER — Emergency Department (HOSPITAL_COMMUNITY)

## 2023-08-07 ENCOUNTER — Other Ambulatory Visit: Payer: Self-pay

## 2023-08-07 ENCOUNTER — Emergency Department (HOSPITAL_COMMUNITY)
Admission: EM | Admit: 2023-08-07 | Discharge: 2023-08-07 | Disposition: A | Discharge status: Home / Self Care | Attending: Emergency Medicine | Admitting: Emergency Medicine

## 2023-08-07 DIAGNOSIS — M545 Low back pain, unspecified: Secondary | ICD-10-CM | POA: Insufficient documentation

## 2023-08-07 DIAGNOSIS — R1084 Generalized abdominal pain: Secondary | ICD-10-CM | POA: Diagnosis not present

## 2023-08-07 DIAGNOSIS — R509 Fever, unspecified: Secondary | ICD-10-CM | POA: Diagnosis not present

## 2023-08-07 DIAGNOSIS — R112 Nausea with vomiting, unspecified: Secondary | ICD-10-CM | POA: Diagnosis present

## 2023-08-07 LAB — COMPREHENSIVE METABOLIC PANEL
ALT: 22 U/L (ref 0–44)
AST: 25 U/L (ref 15–41)
Albumin: 3.7 g/dL (ref 3.5–5.0)
Alkaline Phosphatase: 51 U/L (ref 38–126)
Anion gap: 11 (ref 5–15)
BUN: 14 mg/dL (ref 6–20)
CO2: 19 mmol/L — ABNORMAL LOW (ref 22–32)
Calcium: 8.6 mg/dL — ABNORMAL LOW (ref 8.9–10.3)
Chloride: 108 mmol/L (ref 98–111)
Creatinine, Ser: 1.08 mg/dL (ref 0.61–1.24)
GFR, Estimated: >60 mL/min (ref >60)
Glucose, Bld: 99 mg/dL (ref 70–99)
Potassium: 4 mmol/L (ref 3.5–5.1)
Sodium: 138 mmol/L (ref 135–145)
Total Bilirubin: 1.3 mg/dL — ABNORMAL HIGH (ref 0.0–1.2)
Total Protein: 7.2 g/dL (ref 6.5–8.1)

## 2023-08-07 LAB — RESP PANEL BY RT-PCR (RSV, FLU A&B, COVID)  RVPGX2
Influenza A by PCR: NEGATIVE
Influenza B by PCR: NEGATIVE
Resp Syncytial Virus by PCR: NEGATIVE
SARS Coronavirus 2 by RT PCR: NEGATIVE

## 2023-08-07 LAB — CBC WITH DIFFERENTIAL/PLATELET
Abs Immature Granulocytes: 0.02 10*3/uL (ref 0.00–0.07)
Basophils Absolute: 0 10*3/uL (ref 0.0–0.1)
Basophils Relative: 0 %
Eosinophils Absolute: 0 10*3/uL (ref 0.0–0.5)
Eosinophils Relative: 0 %
HCT: 40 % (ref 39.0–52.0)
Hemoglobin: 13.9 g/dL (ref 13.0–17.0)
Immature Granulocytes: 0 %
Lymphocytes Relative: 10 %
Lymphs Abs: 0.6 10*3/uL — ABNORMAL LOW (ref 0.7–4.0)
MCH: 35.2 pg — ABNORMAL HIGH (ref 26.0–34.0)
MCHC: 34.8 g/dL (ref 30.0–36.0)
MCV: 101.3 fL — ABNORMAL HIGH (ref 80.0–100.0)
Monocytes Absolute: 0.5 10*3/uL (ref 0.1–1.0)
Monocytes Relative: 8 %
Neutro Abs: 5.3 10*3/uL (ref 1.7–7.7)
Neutrophils Relative %: 82 %
Platelets: 228 10*3/uL (ref 150–400)
RBC: 3.95 MIL/uL — ABNORMAL LOW (ref 4.22–5.81)
RDW: 13.2 % (ref 11.5–15.5)
WBC: 6.5 10*3/uL (ref 4.0–10.5)
nRBC: 0 % (ref 0.0–0.2)

## 2023-08-07 LAB — URINALYSIS, ROUTINE W REFLEX MICROSCOPIC
Bilirubin Urine: NEGATIVE
Glucose, UA: NEGATIVE mg/dL
Hgb urine dipstick: NEGATIVE
Ketones, ur: NEGATIVE mg/dL
Leukocytes,Ua: NEGATIVE
Nitrite: NEGATIVE
Protein, ur: 30 mg/dL — AB
Specific Gravity, Urine: 1.027 (ref 1.005–1.030)
pH: 5 (ref 5.0–8.0)

## 2023-08-07 LAB — LIPASE, BLOOD: Lipase: 26 U/L (ref 11–51)

## 2023-08-07 MED ORDER — SODIUM CHLORIDE 0.9 % IV BOLUS
500.0000 mL | Freq: Once | INTRAVENOUS | Status: AC
  Administered 2023-08-07: 500 mL via INTRAVENOUS

## 2023-08-07 MED ORDER — SODIUM CHLORIDE 0.9 % IV BOLUS
1000.0000 mL | Freq: Once | INTRAVENOUS | Status: AC
  Administered 2023-08-07: 1000 mL via INTRAVENOUS

## 2023-08-07 MED ORDER — ONDANSETRON 4 MG PO TBDP
4.0000 mg | ORAL_TABLET | Freq: Once | ORAL | Status: DC
  Filled 2023-08-07: qty 1

## 2023-08-07 MED ORDER — IOHEXOL 350 MG/ML SOLN
75.0000 mL | Freq: Once | INTRAVENOUS | Status: AC | PRN
  Administered 2023-08-07: 75 mL via INTRAVENOUS

## 2023-08-07 MED ORDER — ONDANSETRON HCL 4 MG/2ML IJ SOLN
4.0000 mg | Freq: Once | INTRAMUSCULAR | Status: AC
  Administered 2023-08-07: 4 mg via INTRAVENOUS
  Filled 2023-08-07: qty 2

## 2023-08-07 MED ORDER — ACETAMINOPHEN 500 MG PO TABS
1000.0000 mg | ORAL_TABLET | Freq: Once | ORAL | Status: AC
  Administered 2023-08-07: 1000 mg via ORAL
  Filled 2023-08-07: qty 2

## 2023-08-07 NOTE — ED Provider Notes (Signed)
 [place] EMERGENCY DEPARTMENT AT [facility] Provider Note   CSN: [id number] Arrival date & time: 08/07/23  1257     History  Chief Complaint  Patient presents with   Back Pain   Nausea   Emesis    Larry Lester is a 53 y.o. male.  Patient is a 53 year old male who presents with nausea and vomiting.  He states he has had some abdominal discomfort since this morning.  He had a little bit yesterday.  He describes it as a crampy type feeling all over his abdomen.  He has had some nausea and he says some regurgitation but no clear vomiting.  He had a little bit of a loose stool this morning but no ongoing diarrhea.  No known fevers.  No congestion or coughing.  No urinary symptoms.  He did eat some fish at a restaurant and was wondering if that is what caused it.  He also has chronic back pain and endorses ongoing back pain.  It is in his low back.  At times it radiates down his legs but not currently.  He denies any numbness or weakness in his legs.  No loss of bowel or bladder control.  No fevers.       Home Medications Prior to Admission medications   Medication Sig Start Date End Date Taking? Authorizing Provider  acetaminophen (TYLENOL) 500 MG tablet Take 2 tablets (1,000 mg total) by mouth every 8 (eight) hours. 07/21/23   Netta Corrigan, PA-C  cyclobenzaprine (FLEXERIL) 10 MG tablet Take 1 tablet (10 mg total) by mouth 2 (two) times daily as needed for muscle spasms. 07/21/23   Netta Corrigan, PA-C  hydrOXYzine (ATARAX) 25 MG tablet Take 1 tablet (25 mg total) by mouth every 6 (six) hours as needed for itching. 02/05/22   Jeannie Fend, PA-C  lidocaine (LIDODERM) 5 % Place 1 patch onto the skin daily. Remove & Discard patch within 12 hours or as directed by MD 07/21/23   Netta Corrigan, PA-C  naproxen (NAPROSYN) 500 MG tablet Take 1 tablet (500 mg total) by mouth 2 (two) times daily. 07/12/23   Barrett, Horald Chestnut, PA-C  oxyCODONE (OXY IR/ROXICODONE) 5 MG  immediate release tablet Take 1 tablet (5 mg total) by mouth every 6 (six) hours as needed for moderate pain or breakthrough pain ((score 4 to 6)). 10/02/22   Marrianne Mood, MD      Allergies    Parke Simmers allergy]    Review of Systems   Review of Systems  Constitutional:  Negative for chills, diaphoresis, fatigue and fever.  HENT:  Negative for congestion, rhinorrhea and sneezing.   Eyes: Negative.   Respiratory:  Negative for cough, chest tightness and shortness of breath.   Cardiovascular:  Negative for chest pain and leg swelling.  Gastrointestinal:  Positive for abdominal pain, nausea and vomiting. Negative for blood in stool and diarrhea.  Genitourinary:  Negative for difficulty urinating, flank pain, frequency and hematuria.  Musculoskeletal:  Positive for back pain. Negative for arthralgias.  Skin:  Negative for rash.  Neurological:  Negative for dizziness, speech difficulty, weakness, numbness and headaches.    Physical Exam Updated Vital Signs BP 126/75 (BP Location: Right Arm)   Pulse 76   Temp 99.1 F (37.3 C) (Oral)   Resp 19   Ht 5\' 7"  (1.702 m)   SpO2 98%   BMI 30.56 kg/m  Physical Exam Constitutional:      Appearance: He is well-developed.  HENT:     Head: Normocephalic and atraumatic.  Eyes:     Pupils: Pupils are equal, round, and reactive to light.  Cardiovascular:     Rate and Rhythm: Normal rate and regular rhythm.     Heart sounds: Normal heart sounds.  Pulmonary:     Effort: Pulmonary effort is normal. No respiratory distress.     Breath sounds: Normal breath sounds. No wheezing or rales.  Chest:     Chest wall: No tenderness.  Abdominal:     General: Bowel sounds are normal.     Palpations: Abdomen is soft.     Tenderness: There is no abdominal tenderness. There is no guarding or rebound.  Musculoskeletal:        General: Normal range of motion.     Cervical back: Normal range of motion and neck supple.     Comments: Positive  tenderness throughout the lower lumbosacral spine.  No step-offs or deformities.  Patellar reflexes symmetric bilaterally.  He has normal sensation and motor function to lower extremities bilaterally.  Lymphadenopathy:     Cervical: No cervical adenopathy.  Skin:    General: Skin is warm and dry.     Findings: No rash.  Neurological:     Mental Status: He is alert and oriented to person, place, and time.     ED Results / Procedures / Treatments   Labs (all labs ordered are listed, but only abnormal results are displayed) Labs Reviewed  CBC WITH DIFFERENTIAL/PLATELET - Abnormal; Notable for the following components:      Result Value   RBC 3.95 (*)    MCV 101.3 (*)    MCH 35.2 (*)    Lymphs Abs 0.6 (*)    All other components within normal limits  COMPREHENSIVE METABOLIC PANEL - Abnormal; Notable for the following components:   CO2 19 (*)    Calcium 8.6 (*)    Total Bilirubin 1.3 (*)    All other components within normal limits  URINALYSIS, ROUTINE W REFLEX MICROSCOPIC - Abnormal; Notable for the following components:   APPearance HAZY (*)    Protein, ur 30 (*)    Bacteria, UA RARE (*)    All other components within normal limits  RESP PANEL BY RT-PCR (RSV, FLU A&B, COVID)  RVPGX2  LIPASE, BLOOD    EKG None  Radiology CT ABDOMEN PELVIS W CONTRAST Result Date: 08/07/2023 CLINICAL DATA:  Abdominal pain. EXAM: CT ABDOMEN AND PELVIS WITH CONTRAST TECHNIQUE: Multidetector CT imaging of the abdomen and pelvis was performed using the standard protocol following bolus administration of intravenous contrast. RADIATION DOSE REDUCTION: This exam was performed according to the departmental dose-optimization program which includes automated exposure control, adjustment of the mA and/or kV according to patient size and/or use of iterative reconstruction technique. CONTRAST:  75mL OMNIPAQUE IOHEXOL 350 MG/ML SOLN COMPARISON:  None Available. FINDINGS: Lower chest: Lung bases clear.  No  pleural or pericardial effusion. Hepatobiliary: No focal liver abnormality is seen. No gallstones, gallbladder wall thickening, or biliary dilatation. Pancreas: Unremarkable. No pancreatic ductal dilatation or surrounding inflammatory changes. Spleen: Normal in size without focal abnormality. Adrenals/Urinary Tract: Adrenal glands are unremarkable. Kidneys are normal, without renal calculi, focal lesion, or hydronephrosis. Bladder is unremarkable. Stomach/Bowel: Stomach is within normal limits. Appendix appears normal. No evidence of bowel wall thickening, distention, or inflammatory changes. Vascular/Lymphatic: No significant vascular findings are present. No enlarged abdominal or pelvic lymph nodes. Reproductive: Prostate is unremarkable. Other: No abdominal wall hernia or abnormality. No  abdominopelvic ascites. Musculoskeletal: No acute or significant osseous findings. IMPRESSION: Unremarkable examination of the abdomen and pelvis. Electronically Signed   By: Layla Maw M.D.   On: 08/07/2023 22:26   DG Lumbar Spine Complete Result Date: 08/07/2023 CLINICAL DATA:  Pain. EXAM: LUMBAR SPINE - COMPLETE 4+ VIEW COMPARISON:  Lumbar MRI 06/25/2023 FINDINGS: Five non-rib-bearing lumbar vertebra. Slight straightening of normal lordosis. No listhesis. Normal vertebral body heights. No fracture compression deformity. Minor anterior spurring at multiple levels. Mild L5-S1 disc space narrowing. Slight L5-S1 facet hypertrophy. No pars defects or acute findings. IMPRESSION: Mild degenerative disc disease and facet hypertrophy at L5-S1. Electronically Signed   By: Narda Rutherford M.D.   On: 08/07/2023 17:39    Procedures Procedures    Medications Ordered in ED Medications  ondansetron (ZOFRAN-ODT) disintegrating tablet 4 mg (4 mg Oral Not Given 08/07/23 1619)  sodium chloride 0.9 % bolus 1,000 mL (0 mLs Intravenous Stopped 08/07/23 1803)  ondansetron (ZOFRAN) injection 4 mg (4 mg Intravenous Given 08/07/23  1632)  acetaminophen (TYLENOL) tablet 1,000 mg (1,000 mg Oral Given 08/07/23 1913)  sodium chloride 0.9 % bolus 1,000 mL (0 mLs Intravenous Stopped 08/07/23 2151)  iohexol (OMNIPAQUE) 350 MG/ML injection 75 mL (75 mLs Intravenous Contrast Given 08/07/23 2001)  sodium chloride 0.9 % bolus 500 mL (0 mLs Intravenous Stopped 08/07/23 2340)    ED Course/ Medical Decision Making/ A&P                                 Medical Decision Making Amount and/or Complexity of Data Reviewed Radiology: ordered.  Risk OTC drugs. Prescription drug management.   Patient is a 53 year old who presents with nausea and some emesis with associated abdominal cramping.  He has some generalized tenderness.  No specific areas of point tenderness.  His labs are reviewed and are nonconcerning.  Discussed CT imaging.  At this time we will hold off and give IV fluids and antiemetics.  If he still has abdominal pain, will revisit CT imaging.  He has a history of chronic back pain.  He has been seen in the ED multiple times.  He had a recent MRI at the end of January.  He does not have any new or different symptoms.  No fevers or concerns for osteomyelitis.  No symptomatic concerns for cauda equina syndrome.  Patient always feeling better in the ED.  However he did develop a fever and had some ongoing vague abdominal pain.  Given this, CT scan was performed and shows no evidence of acute abnormality.  Urine does not show any signs of infection.  No evidence of pancreatitis or hepatitis.  No bowel obstruction.  He does not have clinical suggestions of his pneumonia.  His COVID/flu test was negative.  He was given IV fluids.  Overall is feeling better and is able to eat and drink without ongoing vomiting.  I suspect he likely has a viral syndrome.  He was discharged in good condition.  Return precautions were given.  Final Clinical Impression(s) / ED Diagnoses Final diagnoses:  Febrile illness    Rx / DC Orders ED Discharge  Orders     None         Rolan Bucco, MD 08/07/23 2349

## 2023-08-07 NOTE — Care Management (Signed)
 [facility]eless Shelter resources have been placed on patient's AVS.

## 2023-08-07 NOTE — ED Provider Triage Note (Signed)
 Emergency Medicine Provider Triage Evaluation Note  Larry Lester , a 53 y.o. male  was evaluated in triage.  Pt complains of multiple complaints.  Patient reports nausea vomiting and generalized abdominal pain since yesterday.  It is that he ate fish last night and notes that could be causing the symptoms.  Also endorses low back pain that began yesterday.  He is able to ambulate independently does not have any changes to bowel habits or loss of sensation of lower extremities.  Review of Systems  Positive: Nausea, vomiting, abdominal pain, low back pain Negative: Changes to urination or bowel habits, weakness  Physical Exam  BP (!) 113/97 (BP Location: Right Arm)   Pulse 82   Temp 99.3 F (37.4 C)   Resp 20   Ht 5\' 7"  (1.702 m)   SpO2 100%   BMI 30.56 kg/m  Gen:   Awake, no distress   Resp:  Normal effort  MSK:   Moves extremities without difficulty  Other:   Medical Decision Making  Medically screening exam initiated at 1:56 PM.  Appropriate orders placed.  Larry Lester was informed that the remainder of the evaluation will be completed by another provider, this initial triage assessment does not replace that evaluation, and the importance of remaining in the ED until their evaluation is complete.   Larry Marion, PA-C 08/07/23 1357

## 2023-08-07 NOTE — ED Triage Notes (Signed)
 Pt. Stated, I ve had N/V and my back hurting since this morning.

## 2023-08-08 ENCOUNTER — Encounter (HOSPITAL_COMMUNITY): Payer: Self-pay

## 2023-08-08 ENCOUNTER — Emergency Department (HOSPITAL_COMMUNITY)
Admission: EM | Admit: 2023-08-08 | Discharge: 2023-08-08 | Disposition: A | Discharge status: Home / Self Care | Attending: Emergency Medicine | Admitting: Emergency Medicine

## 2023-08-08 ENCOUNTER — Other Ambulatory Visit: Payer: Self-pay

## 2023-08-08 DIAGNOSIS — L299 Pruritus, unspecified: Secondary | ICD-10-CM | POA: Diagnosis not present

## 2023-08-08 DIAGNOSIS — R21 Rash and other nonspecific skin eruption: Secondary | ICD-10-CM | POA: Diagnosis present

## 2023-08-08 MED ORDER — DIPHENHYDRAMINE HCL 25 MG PO CAPS
25.0000 mg | ORAL_CAPSULE | Freq: Once | ORAL | Status: AC
  Administered 2023-08-08: 25 mg via ORAL
  Filled 2023-08-08: qty 1

## 2023-08-08 NOTE — ED Notes (Signed)
 Provider at bedside

## 2023-08-08 NOTE — ED Provider Notes (Signed)
 [name] EMERGENCY DEPARTMENT AT [facility] Provider Note   CSN: [id number] Arrival date & time: 08/08/23  4098     History  Chief Complaint  Patient presents with   Rash    Larry Lester is a 53 y.o. male.  The history is provided by the patient and medical records.  Rash  53 year old male presenting to the ED for alleged allergic reaction.  He states "I am having a reaction to that fluid they gave me earlier".  Seen here earlier this evening and was given Zofran, Tylenol, and IV normal saline.  He does not have any bodily rash.  No lip/tongue swelling.  No SOB.    Home Medications Prior to Admission medications   Medication Sig Start Date End Date Taking? Authorizing Provider  acetaminophen (TYLENOL) 500 MG tablet Take 2 tablets (1,000 mg total) by mouth every 8 (eight) hours. 07/21/23   Netta Corrigan, PA-C  cyclobenzaprine (FLEXERIL) 10 MG tablet Take 1 tablet (10 mg total) by mouth 2 (two) times daily as needed for muscle spasms. 07/21/23   Netta Corrigan, PA-C  hydrOXYzine (ATARAX) 25 MG tablet Take 1 tablet (25 mg total) by mouth every 6 (six) hours as needed for itching. 02/05/22   Jeannie Fend, PA-C  lidocaine (LIDODERM) 5 % Place 1 patch onto the skin daily. Remove & Discard patch within 12 hours or as directed by MD 07/21/23   Netta Corrigan, PA-C  naproxen (NAPROSYN) 500 MG tablet Take 1 tablet (500 mg total) by mouth 2 (two) times daily. 07/12/23   Barrett, Horald Chestnut, PA-C  oxyCODONE (OXY IR/ROXICODONE) 5 MG immediate release tablet Take 1 tablet (5 mg total) by mouth every 6 (six) hours as needed for moderate pain or breakthrough pain ((score 4 to 6)). 10/02/22   Marrianne Mood, MD      Allergies    Parke Simmers allergy]    Review of Systems   Review of Systems  Skin:        Itching  All other systems reviewed and are negative.   Physical Exam Updated Vital Signs BP (!) 90/50 (BP Location: Right Arm)   Pulse 70   Temp 97.6 F (36.4  C)   Resp 18   Ht 5\' 7"  (1.702 m)   Wt 88.5 kg   SpO2 93%   BMI 30.56 kg/m  Physical Exam Vitals and nursing note reviewed.  Constitutional:      Appearance: He is well-developed.     Comments: Sleeping, awoken for exam  HENT:     Head: Normocephalic and atraumatic.     Mouth/Throat:     Comments: No lip/tongue swelling, handling secretions well, no stridor Eyes:     Conjunctiva/sclera: Conjunctivae normal.     Pupils: Pupils are equal, round, and reactive to light.  Cardiovascular:     Rate and Rhythm: Normal rate and regular rhythm.     Heart sounds: Normal heart sounds.  Pulmonary:     Effort: Pulmonary effort is normal.     Breath sounds: Normal breath sounds.  Abdominal:     General: Bowel sounds are normal.     Palpations: Abdomen is soft.  Musculoskeletal:        General: Normal range of motion.     Cervical back: Normal range of motion.  Skin:    General: Skin is warm and dry.     Comments: No rash or bodily redness noted, no urticarial or other lesions present  Neurological:  Mental Status: He is alert and oriented to person, place, and time.     ED Results / Procedures / Treatments   Labs (all labs ordered are listed, but only abnormal results are displayed) Labs Reviewed - No data to display  EKG None  Radiology CT ABDOMEN PELVIS W CONTRAST Result Date: 08/07/2023 CLINICAL DATA:  Abdominal pain. EXAM: CT ABDOMEN AND PELVIS WITH CONTRAST TECHNIQUE: Multidetector CT imaging of the abdomen and pelvis was performed using the standard protocol following bolus administration of intravenous contrast. RADIATION DOSE REDUCTION: This exam was performed according to the departmental dose-optimization program which includes automated exposure control, adjustment of the mA and/or kV according to patient size and/or use of iterative reconstruction technique. CONTRAST:  75mL OMNIPAQUE IOHEXOL 350 MG/ML SOLN COMPARISON:  None Available. FINDINGS: Lower chest: Lung  bases clear.  No pleural or pericardial effusion. Hepatobiliary: No focal liver abnormality is seen. No gallstones, gallbladder wall thickening, or biliary dilatation. Pancreas: Unremarkable. No pancreatic ductal dilatation or surrounding inflammatory changes. Spleen: Normal in size without focal abnormality. Adrenals/Urinary Tract: Adrenal glands are unremarkable. Kidneys are normal, without renal calculi, focal lesion, or hydronephrosis. Bladder is unremarkable. Stomach/Bowel: Stomach is within normal limits. Appendix appears normal. No evidence of bowel wall thickening, distention, or inflammatory changes. Vascular/Lymphatic: No significant vascular findings are present. No enlarged abdominal or pelvic lymph nodes. Reproductive: Prostate is unremarkable. Other: No abdominal wall hernia or abnormality. No abdominopelvic ascites. Musculoskeletal: No acute or significant osseous findings. IMPRESSION: Unremarkable examination of the abdomen and pelvis. Electronically Signed   By: Layla Maw M.D.   On: 08/07/2023 22:26   DG Lumbar Spine Complete Result Date: 08/07/2023 CLINICAL DATA:  Pain. EXAM: LUMBAR SPINE - COMPLETE 4+ VIEW COMPARISON:  Lumbar MRI 06/25/2023 FINDINGS: Five non-rib-bearing lumbar vertebra. Slight straightening of normal lordosis. No listhesis. Normal vertebral body heights. No fracture compression deformity. Minor anterior spurring at multiple levels. Mild L5-S1 disc space narrowing. Slight L5-S1 facet hypertrophy. No pars defects or acute findings. IMPRESSION: Mild degenerative disc disease and facet hypertrophy at L5-S1. Electronically Signed   By: Narda Rutherford M.D.   On: 08/07/2023 17:39    Procedures Procedures    Medications Ordered in ED Medications  diphenhydrAMINE (BENADRYL) capsule 25 mg (25 mg Oral Given 08/08/23 0546)    ED Course/ Medical Decision Making/ A&P                                 Medical Decision Making   53 year old male here with generalized  itching.  He thinks he is having a reaction to the "fluid" that was given to him earlier.  He was given Zofran, Tylenol, and normal saline.  He is sleeping when I approached him, awoken for exam.  He does not have any discrete rash noted on exam.  She does not have any lip or tongue swelling, handling secretions well, no stridor.  Vitals are stable.  I do not feel that this is an allergic reaction.  He was given dose of Benadryl for itching.  Appears stable for discharge.   Can continue same at home.  Return here for new concerns.  Final Clinical Impression(s) / ED Diagnoses Final diagnoses:  Itching    Rx / DC Orders ED Discharge Orders     None         Garlon Hatchet, PA-C 08/08/23 0556    Zadie Rhine, MD 08/08/23 [id number]

## 2023-08-08 NOTE — ED Triage Notes (Signed)
 Pt was seen previously tonight and was given zofran, tylenol, and IV fluid. Pt states he feels like he is having a reaction because he is itching all over. Pt has redness on abdomen, back, arms and legs. Pt denies breathing difficulty and/or trouble swallowing.

## 2023-08-08 NOTE — Discharge Instructions (Addendum)
 You can continue over the counter benadryl as needed for itching.

## 2023-08-08 NOTE — ED Notes (Signed)
 AVS provided by edp was reviewed with pt. Pt verbalized understanding with no additional questions at this time.

## 2024-02-28 ENCOUNTER — Emergency Department (HOSPITAL_COMMUNITY)
Admission: EM | Admit: 2024-02-28 | Discharge: 2024-02-29 | Disposition: A | Discharge status: Home / Self Care | Attending: Emergency Medicine | Admitting: Emergency Medicine

## 2024-02-28 ENCOUNTER — Other Ambulatory Visit: Payer: Self-pay

## 2024-02-28 ENCOUNTER — Encounter (HOSPITAL_COMMUNITY): Payer: Self-pay | Admitting: *Deleted

## 2024-02-28 DIAGNOSIS — M545 Low back pain, unspecified: Secondary | ICD-10-CM | POA: Diagnosis present

## 2024-02-28 DIAGNOSIS — L509 Urticaria, unspecified: Secondary | ICD-10-CM | POA: Insufficient documentation

## 2024-02-28 DIAGNOSIS — M48061 Spinal stenosis, lumbar region without neurogenic claudication: Secondary | ICD-10-CM | POA: Diagnosis not present

## 2024-02-28 DIAGNOSIS — M5441 Lumbago with sciatica, right side: Secondary | ICD-10-CM

## 2024-02-28 DIAGNOSIS — M4800 Spinal stenosis, site unspecified: Secondary | ICD-10-CM

## 2024-02-28 LAB — CBC
HCT: 41.6 % (ref 39.0–52.0)
Hemoglobin: 14.7 g/dL (ref 13.0–17.0)
MCH: 36.7 pg — ABNORMAL HIGH (ref 26.0–34.0)
MCHC: 35.3 g/dL (ref 30.0–36.0)
MCV: 103.7 fL — ABNORMAL HIGH (ref 80.0–100.0)
Platelets: 249 K/uL (ref 150–400)
RBC: 4.01 MIL/uL — ABNORMAL LOW (ref 4.22–5.81)
RDW: 12.3 % (ref 11.5–15.5)
WBC: 15.5 K/uL — ABNORMAL HIGH (ref 4.0–10.5)
nRBC: 0 % (ref 0.0–0.2)

## 2024-02-28 LAB — COMPREHENSIVE METABOLIC PANEL WITH GFR
ALT: 21 U/L (ref 0–44)
AST: 29 U/L (ref 15–41)
Albumin: 3.4 g/dL — ABNORMAL LOW (ref 3.5–5.0)
Alkaline Phosphatase: 70 U/L (ref 38–126)
Anion gap: 11 (ref 5–15)
BUN: 15 mg/dL (ref 6–20)
CO2: 22 mmol/L (ref 22–32)
Calcium: 9 mg/dL (ref 8.9–10.3)
Chloride: 107 mmol/L (ref 98–111)
Creatinine, Ser: 1.24 mg/dL (ref 0.61–1.24)
GFR, Estimated: >60 mL/min (ref >60)
Glucose, Bld: 118 mg/dL — ABNORMAL HIGH (ref 70–99)
Potassium: 4.2 mmol/L (ref 3.5–5.1)
Sodium: 140 mmol/L (ref 135–145)
Total Bilirubin: 0.8 mg/dL (ref 0.0–1.2)
Total Protein: 7.2 g/dL (ref 6.5–8.1)

## 2024-02-28 MED ORDER — DIPHENHYDRAMINE HCL 25 MG PO CAPS
50.0000 mg | ORAL_CAPSULE | Freq: Once | ORAL | Status: AC
Start: 2024-02-28 — End: 2024-02-28
  Administered 2024-02-28: 50 mg via ORAL
  Filled 2024-02-28: qty 2

## 2024-02-28 NOTE — ED Triage Notes (Signed)
 The pt also has back pain and he is allergic to detergent he has scattered hives last night

## 2024-02-28 NOTE — ED Triage Notes (Signed)
 The pt does not feel well multiple complaints back pain   cough   for 2 days

## 2024-02-29 ENCOUNTER — Emergency Department (HOSPITAL_COMMUNITY)

## 2024-02-29 DIAGNOSIS — M48061 Spinal stenosis, lumbar region without neurogenic claudication: Secondary | ICD-10-CM | POA: Diagnosis not present

## 2024-02-29 LAB — C-REACTIVE PROTEIN: CRP: 5.6 mg/dL — ABNORMAL HIGH (ref <1.0)

## 2024-02-29 LAB — DIFFERENTIAL
Abs Immature Granulocytes: 0.06 K/uL (ref 0.00–0.07)
Basophils Absolute: 0 K/uL (ref 0.0–0.1)
Basophils Relative: 0 %
Eosinophils Absolute: 0 K/uL (ref 0.0–0.5)
Eosinophils Relative: 0 %
Immature Granulocytes: 0 %
Lymphocytes Relative: 12 %
Lymphs Abs: 2 K/uL (ref 0.7–4.0)
Monocytes Absolute: 0.7 K/uL (ref 0.1–1.0)
Monocytes Relative: 4 %
Neutro Abs: 13.1 K/uL — ABNORMAL HIGH (ref 1.7–7.7)
Neutrophils Relative %: 84 %

## 2024-02-29 LAB — RESP PANEL BY RT-PCR (RSV, FLU A&B, COVID)  RVPGX2
Influenza A by PCR: NEGATIVE
Influenza B by PCR: NEGATIVE
Resp Syncytial Virus by PCR: NEGATIVE
SARS Coronavirus 2 by RT PCR: NEGATIVE

## 2024-02-29 LAB — SEDIMENTATION RATE: Sed Rate: 10 mm/h (ref 0–16)

## 2024-02-29 MED ORDER — SODIUM CHLORIDE 0.9 % IV SOLN
2.0000 g | Freq: Once | INTRAVENOUS | Status: AC
  Administered 2024-02-29: 2 g via INTRAVENOUS
  Filled 2024-02-29: qty 12.5

## 2024-02-29 MED ORDER — LIDOCAINE 5 % EX PTCH: 1.0000 | MEDICATED_PATCH | CUTANEOUS | 0 refills | Status: AC

## 2024-02-29 MED ORDER — METHYLPREDNISOLONE 4 MG PO TBPK: ORAL_TABLET | ORAL | 0 refills | Status: AC

## 2024-02-29 MED ORDER — FENTANYL CITRATE PF 50 MCG/ML IJ SOSY
50.0000 ug | PREFILLED_SYRINGE | Freq: Once | INTRAMUSCULAR | Status: AC
  Administered 2024-02-29: 50 ug via INTRAVENOUS
  Filled 2024-02-29: qty 1

## 2024-02-29 MED ORDER — CYCLOBENZAPRINE HCL 10 MG PO TABS: 10.0000 mg | ORAL_TABLET | Freq: Two times a day (BID) | ORAL | 0 refills | Status: AC | PRN

## 2024-02-29 MED ORDER — VANCOMYCIN HCL 1500 MG/300ML IV SOLN
1500.0000 mg | Freq: Once | INTRAVENOUS | Status: AC
  Administered 2024-02-29: 1500 mg via INTRAVENOUS
  Filled 2024-02-29: qty 300

## 2024-02-29 MED ORDER — OXYCODONE HCL 5 MG PO TABS: 5.0000 mg | ORAL_TABLET | Freq: Four times a day (QID) | ORAL | 0 refills | Status: AC | PRN

## 2024-02-29 MED ORDER — FAMOTIDINE 20 MG PO TABS
20.0000 mg | ORAL_TABLET | Freq: Once | ORAL | Status: AC
  Administered 2024-02-29: 20 mg via ORAL
  Filled 2024-02-29: qty 1

## 2024-02-29 MED ORDER — GADOBUTROL 1 MMOL/ML IV SOLN
9.0000 mL | Freq: Once | INTRAVENOUS | Status: AC | PRN
Start: 2024-02-29 — End: 2024-02-29
  Administered 2024-02-29: 9 mL via INTRAVENOUS

## 2024-02-29 MED ORDER — LORAZEPAM 1 MG PO TABS: 0.5000 mg | ORAL_TABLET | ORAL | Status: DC | PRN

## 2024-02-29 NOTE — Progress Notes (Signed)
 ED Pharmacy Antibiotic Sign Off An antibiotic consult was received from an ED provider for Vancomycin  and Cefepime per pharmacy dosing for Osteomyelitis. A chart review was completed to assess appropriateness.   The following one time order(s) were placed:  Vancomycin  1500 mg IV Cefepime 2 g IV  Further antibiotic and/or antibiotic pharmacy consults should be ordered by the admitting provider if indicated.   Dail Cordella Misty, [facility]  Clinical Pharmacist 02/29/24 1:39 AM

## 2024-02-29 NOTE — ED Notes (Signed)
 Pt to MRI

## 2024-02-29 NOTE — Discharge Instructions (Addendum)
 Your MRI showed evidence of spinal stenosis but no indication of an acute abnormality requiring neurosurgical intervention.  Please follow-up outpatient with neurosurgery, pain medication, Flexeril  (muscle relaxant), lidocaine  patch and steroids have been prescribed.  Do not operate heavy machinery while taking pain medication muscle relaxants for

## 2024-02-29 NOTE — ED Provider Notes (Signed)
 [place] EMERGENCY DEPARTMENT AT [facility] Provider Note   CSN: [id number] Arrival date & time: 02/28/24  2004     Patient presents with: multiple complaints   Larry Lester is a 53 y.o. male.   HPI   53 year old male with medical history significant for substance use disorder, cervical discitis and epidural phlegmon requiring surgery by neurosurgery 1 year ago presenting to the emergency department with severe low back pain and weakness in the bilateral lower extremities.  The patient denies IV drug abuse.  1 year ago he had discitis and osteomyelitis of the C6-C7 region with associated cervical radiculopathy and an MRI at the time and showed severe C6-C7 discitis osteomyelitis with epidural phlegmon and punctate prevertebral abscesses.  Neurosurgery obtained cultures through anterior neck exploration on 09/26/2022 which grew rare Streptococcus pneumonia and was discharged on antibiotics.  The patient states that he has had a cough for the past 2 days but for several days longer than that he has been feeling unwell with back pain in his low back that has progressively worsened.  Endorses bilateral lower extremity weakness, denies any saddle anesthesia, denies any urinary or fecal incontinence.  He endorses some numbness in his bilateral lower extremities as well.  No recent falls or trauma.  He denies any abdominal pain.  Additionally, he has had an outbreak of hives along his thigh which has not improved with Benadryl .  He denies any new shortness of breath or wheezing, nausea, throat swelling or mouth/tongue swelling.  Prior to Admission medications   Medication Sig Start Date End Date Taking? Authorizing Provider  methylPREDNISolone (MEDROL DOSEPAK) 4 MG TBPK tablet Take as directed on the box 02/29/24  Yes Jerrol Agent, MD  acetaminophen  (TYLENOL ) 500 MG tablet Take 2 tablets (1,000 mg total) by mouth every 8 (eight) hours. 07/21/23   Victor Agent DASEN, PA-C  cyclobenzaprine   (FLEXERIL ) 10 MG tablet Take 1 tablet (10 mg total) by mouth 2 (two) times daily as needed for muscle spasms. 02/29/24   Jerrol Agent, MD  hydrOXYzine  (ATARAX ) 25 MG tablet Take 1 tablet (25 mg total) by mouth every 6 (six) hours as needed for itching. 02/05/22   Beverley Leita LABOR, PA-C  lidocaine  (LIDODERM ) 5 % Place 1 patch onto the skin daily. Remove & Discard patch within 12 hours or as directed by MD 02/29/24   Jerrol Agent, MD  naproxen  (NAPROSYN ) 500 MG tablet Take 1 tablet (500 mg total) by mouth 2 (two) times daily. 07/12/23   Barrett, Warren SAILOR, PA-C  oxyCODONE  (OXY IR/ROXICODONE ) 5 MG immediate release tablet Take 1 tablet (5 mg total) by mouth every 6 (six) hours as needed for moderate pain (pain score 4-6) or breakthrough pain ((score 4 to 6)). 02/29/24   Jerrol Agent, MD    Allergies: Georgianne cerise allergy]    Review of Systems  All other systems reviewed and are negative.   Updated Vital Signs BP 98/73 (BP Location: Left Arm)   Pulse 83   Temp 98.1 F (36.7 C) (Oral)   Resp 18   Ht 5' 7 (1.702 m)   Wt 88.5 kg   SpO2 100%   BMI 30.56 kg/m   Physical Exam Vitals and nursing note reviewed.  Constitutional:      General: He is not in acute distress.    Appearance: He is well-developed.  HENT:     Head: Normocephalic and atraumatic.  Eyes:     Conjunctiva/sclera: Conjunctivae normal.  Cardiovascular:  Rate and Rhythm: Normal rate and regular rhythm.  Pulmonary:     Effort: Pulmonary effort is normal. No respiratory distress.     Breath sounds: Normal breath sounds.  Abdominal:     Palpations: Abdomen is soft.     Tenderness: There is no abdominal tenderness.  Musculoskeletal:        General: No swelling.     Cervical back: Neck supple.  Skin:    General: Skin is warm and dry.     Capillary Refill: Capillary refill takes less than 2 seconds.     Findings: Rash present.     Comments: Hives noted to the anterior/medial left thigh  Neurological:     Mental  Status: He is alert.     Comments: 5/5 strength in the bilateral upper extremities and intact sensation to light touch. 4/5 bilateral LE strength with subjectively diminished sensation to light touch bilaterally  Psychiatric:        Mood and Affect: Mood normal.     (all labs ordered are listed, but only abnormal results are displayed) Labs Reviewed  COMPREHENSIVE METABOLIC PANEL WITH GFR - Abnormal; Notable for the following components:      Result Value   Glucose, Bld 118 (*)    Albumin 3.4 (*)    All other components within normal limits  CBC - Abnormal; Notable for the following components:   WBC 15.5 (*)    RBC 4.01 (*)    MCV 103.7 (*)    MCH 36.7 (*)    All other components within normal limits  DIFFERENTIAL - Abnormal; Notable for the following components:   Neutro Abs 13.1 (*)    All other components within normal limits  C-REACTIVE PROTEIN - Abnormal; Notable for the following components:   CRP 5.6 (*)    All other components within normal limits  RESP PANEL BY RT-PCR (RSV, FLU A&B, COVID)  [id number]  SEDIMENTATION RATE    EKG: None  Radiology: MR THORACIC SPINE W WO CONTRAST Result Date: 02/29/2024 EXAM: MRI THORACIC SPINE WITH AND WITHOUT INTRAVENOUS CONTRAST 02/29/2024 06:20:45 AM TECHNIQUE: Multiplanar multisequence MRI of the thoracic spine was performed with and without the administration of 9 mL of gadobutrol  (GADAVIST ) 1 MMOL/ML injection intravenous contrast. COMPARISON: CT of the cervical spine dated 03/29/2023. CLINICAL HISTORY: Myelopathy, acute, thoracic spine. FINDINGS: BONES AND ALIGNMENT: Normal alignment. Normal vertebral body heights. Bone marrow signal is unremarkable. No abnormal enhancement. The C6 and C7 vertebral bodies are again noted to be fused. SPINAL CORD: Normal spinal cord volume. Normal spinal cord signal. There is no abnormal contrast enhancement. SOFT TISSUES: Unremarkable. DEGENERATIVE CHANGES: There is a small right paracentral disc  protrusion at T9-T10, which indents the ventral thecal sac but does not impinge upon the spinal cord or exiting nerve roots. No spinal canal stenosis or neural foraminal narrowing. IMPRESSION: 1. Small right paracentral disc protrusion at T9-10 indenting the ventral thecal sac without spinal cord or nerve root impingement. 2. No abnormal contrast enhancement. Electronically signed by: Evalene Coho MD 02/29/2024 06:51 AM EDT RP Workstation: [id number]   MR Lumbar Spine W Tommye Contrast Result Date: 02/29/2024 EXAM: MRI LUMBAR SPINE 02/29/2024 06:20:22 AM TECHNIQUE: Multiplanar multisequence MRI of the lumbar spine was performed with and without the administration of 9 mL gadobutrol  (GADAVIST ) 1 MMOL/ML intravenous contrast. COMPARISON: MRI of the lumbar spine dated 06/25/2023. CLINICAL HISTORY: Myelopathy, acute, lumbar spine. FINDINGS: BONES AND ALIGNMENT: Mild straightening of the normal lumbar lordosis. Normal vertebral body heights. Bone marrow signal  is unremarkable. SPINAL CORD: The conus medullaris terminates at L1. There is no abnormal enhancement of the spinal cord or nerve roots. SOFT TISSUES: No paraspinal mass. L1-L2: No significant disc herniation. No spinal canal stenosis or neural foraminal narrowing. L2-L3: Mild diffuse disc bulging with mild central spinal canal stenosis and mild bilateral lateral recess stenosis, but no apparent nerve root impingement. L3-L4: Mild diffuse disc bulging with mild central spinal canal stenosis and mild bilateral lateral recess stenosis, but no apparent nerve root impingement. L4-L5: Mild diffuse disc bulging with mild central spinal canal stenosis and mild bilateral lateral recess stenosis, but no apparent nerve root impingement. L5-S1: Mild diffuse disc bulging with mild central spinal canal stenosis and mild bilateral lateral recess stenosis, but no apparent nerve root impingement. IMPRESSION: 1. Mild multilevel lumbar spondylosis with diffuse disc bulging at  L2-3 through L5-S1 causing mild central canal and bilateral lateral recess stenosis at each level without nerve root impingement. 2. No abnormal enhancement of the spinal cord or nerve roots. Electronically signed by: Evalene Coho MD 02/29/2024 06:44 AM EDT RP Workstation: [id number]   DG Chest 2 View Result Date: 02/29/2024 EXAM: 2 VIEW(S) XRAY OF THE CHEST 02/29/2024 01:31:00 AM COMPARISON: Comparison 09/26/2022. CLINICAL HISTORY: cough. cough. FINDINGS: LUNGS AND PLEURA: No focal pulmonary opacity. No pulmonary edema. No pleural effusion. No pneumothorax. HEART AND MEDIASTINUM: No acute abnormality of the cardiac and mediastinal silhouettes. BONES AND SOFT TISSUES: No acute osseous abnormality. IMPRESSION: 1. No acute cardiopulmonary process. Electronically signed by: Franky Stanford MD 02/29/2024 02:02 AM EDT RP Workstation: [id number]     Procedures   Medications Ordered in the ED  diphenhydrAMINE  (BENADRYL ) capsule 50 mg (50 mg Oral Given 02/28/24 2051)  fentaNYL  (SUBLIMAZE ) injection 50 mcg (50 mcg Intravenous Given 02/29/24 0151)  vancomycin  (VANCOREADY) IVPB 1500 mg/300 mL (0 mg Intravenous Stopped 02/29/24 0513)  ceFEPIme (MAXIPIME) 2 g in sodium chloride  0.9 % 100 mL IVPB (0 g Intravenous Stopped 02/29/24 0303)  famotidine (PEPCID) tablet 20 mg (20 mg Oral Given 02/29/24 0151)  gadobutrol  (GADAVIST ) 1 MMOL/ML injection 9 mL (9 mLs Intravenous Contrast Given 02/29/24 0610)                                    Medical Decision Making Amount and/or Complexity of Data Reviewed Labs: ordered. Radiology: ordered.  Risk Prescription drug management.    53 year old male with medical history significant for substance use disorder, cervical discitis and epidural phlegmon requiring surgery by neurosurgery 1 year ago presenting to the emergency department with severe low back pain and weakness in the bilateral lower extremities.  The patient denies IV drug abuse.  1 year ago he had discitis and  osteomyelitis of the C6-C7 region with associated cervical radiculopathy and an MRI at the time and showed severe C6-C7 discitis osteomyelitis with epidural phlegmon and punctate prevertebral abscesses.  Neurosurgery obtained cultures through anterior neck exploration on 09/26/2022 which grew rare Streptococcus pneumonia and was discharged on antibiotics.  The patient states that he has had a cough for the past 2 days but for several days longer than that he has been feeling unwell with back pain in his low back that has progressively worsened.  Endorses bilateral lower extremity weakness, denies any saddle anesthesia, denies any urinary or fecal incontinence.  He endorses some numbness in his bilateral lower extremities as well.  No recent falls or trauma.  He denies any abdominal pain.  Additionally, he has had an outbreak of hives along his thigh which has not improved with Benadryl .  He denies any new shortness of breath or wheezing, nausea, throat swelling or mouth/tongue swelling.  On arrival, the patient was afebrile, not tachycardic or tachypneic, BP 109/80, saturating 100% on room air.  Patient presenting with severe back pain and bilateral lower extremity weakness.  Given the patient's history, considered epidural abscess, discitis/osteomyelitis, less concern for fracture, considered degenerative disc disease and radiculopathy and/or developing myelopathy.  Consider musculoskeletal strain.  Additionally, the patient presents with hives on his thigh.  He denies any abdominal pain, low concern for aortic dissection or ruptured AAA.  Labs: CBC with a leukocytosis to 15.5, CMP generally unremarkable, CRP elevated at 5.6, differential includes neutrophilia, ESR normal, COVID flu and RSV PCR testing negative.  Chest x-ray revealed no acute abnormality.  MRI imaging of the lumbar and thoracic spine with and without contrast was ordered given the patient's history of discitis and osteomyelitis and severe  back pain with bilateral lower extremity weakness.  Patient was covered with broad-spectrum antibiotics to include vancomycin , cefepime in the setting of his presentation.  He was administered Benadryl  and Pepcid for his hives of unclear etiology.  No evidence of anaphylaxis.    MRI Throracic Spine: IMPRESSION:  1. Small right paracentral disc protrusion at T9-10 indenting the ventral  thecal sac without spinal cord or nerve root impingement.  2. No abnormal contrast enhancement.   MRI Lumbar Spine: IMPRESSION:  1. Mild multilevel lumbar spondylosis with diffuse disc bulging at L2-3 through  L5-S1 causing mild central canal and bilateral lateral recess stenosis at each  level without nerve root impingement.  2. No abnormal enhancement of the spinal cord or nerve roots.    Pt feeling symptomatically improved on repeat assessment, able to ambulate without difficulty, well appearing. Reassuring workup in the ED pointing away from emergent process in the thoracic or lumbar spine. Will treat symptomatically with multimodal pain control, have the patient follow-up with his PCP, spine surgery as needed.     Final diagnoses:  Hives  Acute midline low back pain with bilateral sciatica  Spinal stenosis, unspecified spinal region    ED Discharge Orders          Ordered    cyclobenzaprine  (FLEXERIL ) 10 MG tablet  2 times daily PRN        02/29/24 0701    lidocaine  (LIDODERM ) 5 %  Every 24 hours        02/29/24 0701    oxyCODONE  (OXY IR/ROXICODONE ) 5 MG immediate release tablet  Every 6 hours PRN        02/29/24 0701    methylPREDNISolone (MEDROL DOSEPAK) 4 MG TBPK tablet        02/29/24 0701               Jerrol Agent, MD 02/29/24 2310

## 2024-02-29 NOTE — ED Notes (Signed)
 Patient transported to X-ray
